# Patient Record
Sex: Female | Born: 1965 | Race: Black or African American | Hispanic: No | Smoking: Never smoker
Health system: Southern US, Community
[De-identification: ages and names within clinical notes are randomized; demographics above are authoritative.]

## PROBLEM LIST (undated history)

## (undated) DIAGNOSIS — E785 Hyperlipidemia, unspecified: Secondary | ICD-10-CM

## (undated) DIAGNOSIS — F419 Anxiety disorder, unspecified: Secondary | ICD-10-CM

## (undated) DIAGNOSIS — Z87442 Personal history of urinary calculi: Secondary | ICD-10-CM

## (undated) DIAGNOSIS — R7303 Prediabetes: Secondary | ICD-10-CM

## (undated) DIAGNOSIS — K219 Gastro-esophageal reflux disease without esophagitis: Secondary | ICD-10-CM

## (undated) DIAGNOSIS — I10 Essential (primary) hypertension: Secondary | ICD-10-CM

## (undated) HISTORY — PX: DILATION AND CURETTAGE OF UTERUS: SHX78

---

## 1997-10-01 ENCOUNTER — Inpatient Hospital Stay (HOSPITAL_COMMUNITY): Admission: AD | Admit: 1997-10-01 | Discharge: 1997-10-01 | Payer: Self-pay | Admitting: *Deleted

## 1997-10-03 ENCOUNTER — Inpatient Hospital Stay (HOSPITAL_COMMUNITY): Admission: AD | Admit: 1997-10-03 | Discharge: 1997-10-07 | Payer: Self-pay | Admitting: Obstetrics and Gynecology

## 1997-10-07 ENCOUNTER — Encounter: Admission: RE | Admit: 1997-10-07 | Discharge: 1998-01-05 | Payer: Self-pay | Admitting: Obstetrics and Gynecology

## 2000-11-22 ENCOUNTER — Other Ambulatory Visit: Admission: RE | Admit: 2000-11-22 | Discharge: 2000-11-22 | Payer: Self-pay | Admitting: Obstetrics and Gynecology

## 2001-11-22 ENCOUNTER — Other Ambulatory Visit: Admission: RE | Admit: 2001-11-22 | Discharge: 2001-11-22 | Payer: Self-pay | Admitting: Obstetrics and Gynecology

## 2003-01-01 ENCOUNTER — Other Ambulatory Visit: Admission: RE | Admit: 2003-01-01 | Discharge: 2003-01-01 | Payer: Self-pay | Admitting: Obstetrics and Gynecology

## 2004-01-13 ENCOUNTER — Other Ambulatory Visit: Admission: RE | Admit: 2004-01-13 | Discharge: 2004-01-13 | Payer: Self-pay | Admitting: Obstetrics and Gynecology

## 2005-04-29 ENCOUNTER — Other Ambulatory Visit: Admission: RE | Admit: 2005-04-29 | Discharge: 2005-04-29 | Payer: Self-pay | Admitting: Obstetrics and Gynecology

## 2011-01-18 ENCOUNTER — Encounter: Payer: Self-pay | Admitting: Emergency Medicine

## 2011-01-18 ENCOUNTER — Inpatient Hospital Stay (INDEPENDENT_AMBULATORY_CARE_PROVIDER_SITE_OTHER)
Admission: RE | Admit: 2011-01-18 | Discharge: 2011-01-18 | Disposition: A | Discharge status: Home / Self Care | Payer: BC Managed Care – PPO | Source: Ambulatory Visit | Attending: Emergency Medicine | Admitting: Emergency Medicine

## 2011-01-18 DIAGNOSIS — L255 Unspecified contact dermatitis due to plants, except food: Secondary | ICD-10-CM | POA: Insufficient documentation

## 2011-07-25 NOTE — Progress Notes (Signed)
Summary: RASH ON ARM AND NECK/TJ rm 2   Vital Signs:  Patient Profile:   45 Years Old Female CC:      Rash  x 1wk Height:     61.5 inches Weight:      152.75 pounds O2 Sat:      99 % O2 treatment:    Room Air Temp:     98.4 degrees F oral Pulse rate:   75 / minute Resp:     14 per minute BP sitting:   128 / 83  (left arm) Cuff size:   regular  Vitals Entered By: Clemens Catholic LPN (Jan 18, 2011 5:51 PM)                  Updated Prior Medication List: VALTREX 500 MG TABS (VALACYCLOVIR HCL)  WELLBUTRIN XL 300 MG XR24H-TAB (BUPROPION HCL)  NUVARING 0.12-0.015 MG/24HR RING (ETONOGESTREL-ETHINYL ESTRADIOL)  CITALOPRAM HYDROBROMIDE 40 MG TABS (CITALOPRAM HYDROBROMIDE)   Current Allergies: ! AMOXICILLINHistory of Present Illness History from: patient Chief Complaint: Rash  x 1wk History of Present Illness: She was outside doing yardwork about a week ago and thinks she got into some poison ivy. She developed an itchy red rash on her arms and now on her neck as well. Very itchy and irritating.  No F/C/N/V.  Benedryl only helping a little bit.  REVIEW OF SYSTEMS Constitutional Symptoms      Denies fever, chills, night sweats, weight loss, weight gain, and fatigue.  Eyes       Denies change in vision, eye pain, eye discharge, glasses, contact lenses, and eye surgery. Ear/Nose/Throat/Mouth       Denies hearing loss/aids, change in hearing, ear pain, ear discharge, dizziness, frequent runny nose, frequent nose bleeds, sinus problems, sore throat, hoarseness, and tooth pain or bleeding.  Respiratory       Denies dry cough, productive cough, wheezing, shortness of breath, asthma, bronchitis, and emphysema/COPD.  Cardiovascular       Denies murmurs, chest pain, and tires easily with exhertion.    Gastrointestinal       Denies stomach pain, nausea/vomiting, diarrhea, constipation, blood in bowel movements, and indigestion. Genitourniary       Denies painful urination, kidney  stones, and loss of urinary control. Neurological       Denies paralysis, seizures, and fainting/blackouts. Musculoskeletal       Denies muscle pain, joint pain, joint stiffness, decreased range of motion, redness, swelling, muscle weakness, and gout.  Skin       Denies bruising, unusual mles/lumps or sores, and hair/skin or nail changes.  Psych       Denies mood changes, temper/anger issues, anxiety/stress, speech problems, depression, and sleep problems. Other Comments: pt c/o rash on both arms and on her neck x 1wk . she has used benadryl pills and cream with no relief.   Past History:  Past Medical History: Unremarkable  Past Surgical History: Caesarean section  Family History: Family History Diabetes 1st degree relative  Social History: Never Smoked Alcohol use-yes 2 per mth Drug use-no Smoking Status:  never Drug Use:  no Physical Exam General appearance: well developed, well nourished, no acute distress Oral/Pharynx: tongue normal, posterior pharynx without erythema or exudate MSE: oriented to time, place, and person Scattered raised excoriations and erythema c/w poison ivy dermatitis on her arms. Assessment New Problems: POISON IVY DERMATITIS (ICD-692.6) POISON IVY DERMATITIS (ICD-692.6) FAMILY HISTORY DIABETES 1ST DEGREE RELATIVE (ICD-V18.0)   Plan New Medications/Changes: PREDNISONE (PAK) 10 MG TABS (PREDNISONE)  use as directed, 6 day pack  #1 x 0, 01/18/2011, Hoyt Koch MD  New Orders: Solumedrol up to 125mg  [J2930] Admin of Therapeutic Inj  intramuscular or subcutaneous [96372] New Patient Level III [99203] Planning Comments:   Treat with Solumedrol IM now + 6 day pred pack taper starting tomorrow.  Avoid scratching, use cool compresses.  Can continue topical or oral benedryl.  Should gradually improve over the next few days but may still get a few breakouts here and there.   The patient and/or caregiver has been counseled thoroughly with regard  to medications prescribed including dosage, schedule, interactions, rationale for use, and possible side effects and they verbalize understanding.  Diagnoses and expected course of recovery discussed and will return if not improved as expected or if the condition worsens. Patient and/or caregiver verbalized understanding.  Prescriptions: PREDNISONE (PAK) 10 MG TABS (PREDNISONE) use as directed, 6 day pack  #1 x 0   Entered and Authorized by:   Hoyt Koch MD   Signed by:   Hoyt Koch MD on 01/18/2011   Method used:   Print then Give to Patient   RxID:   [id number]   Medication Administration  Injection # 1:    Medication: Solumedrol up to 125mg     Diagnosis: POISON IVY DERMATITIS (ICD-692.6)    Route: IM    Site: LUOQ gluteus    Exp Date: 08/22/2013    Lot #: [id number]    Mfr: [employer]    Patient tolerated injection without complications    Given by: Clemens Catholic LPN (Jan 18, 2011 6:19 PM)  Orders Added: 1)  Solumedrol up to 125mg  [J2930] 2)  Admin of Therapeutic Inj  intramuscular or subcutaneous [96372] 3)  New Patient Level III [14782]

## 2014-05-02 ENCOUNTER — Other Ambulatory Visit: Payer: Self-pay | Admitting: Gastroenterology

## 2014-05-02 DIAGNOSIS — R142 Eructation: Secondary | ICD-10-CM

## 2014-05-13 ENCOUNTER — Ambulatory Visit
Admission: RE | Admit: 2014-05-13 | Discharge: 2014-05-13 | Disposition: A | Discharge status: Home / Self Care | Payer: BC Managed Care – PPO | Source: Ambulatory Visit | Attending: Gastroenterology | Admitting: Gastroenterology

## 2014-05-13 ENCOUNTER — Encounter (INDEPENDENT_AMBULATORY_CARE_PROVIDER_SITE_OTHER): Payer: Self-pay

## 2014-05-13 DIAGNOSIS — R142 Eructation: Secondary | ICD-10-CM

## 2015-01-28 ENCOUNTER — Other Ambulatory Visit: Payer: Self-pay | Admitting: Oncology

## 2015-11-27 IMAGING — RF DG UGI W/ HIGH DENSITY W/KUB
19 of 24 series · 19 of 24 positions shown · non-contrast
Comparison: None.

CLINICAL DATA: Gastroesophageal reflux.

EXAM:
UPPER GI SERIES WITH KUB
TECHNIQUE: After obtaining a scout radiograph a routine upper GI series was
performed using thin and high density barium.
FLUOROSCOPY TIME:  2 min and 18 seconds

[Series 1: run · 1 of 1 slices shown (1 of 19)]
[im 1/1]
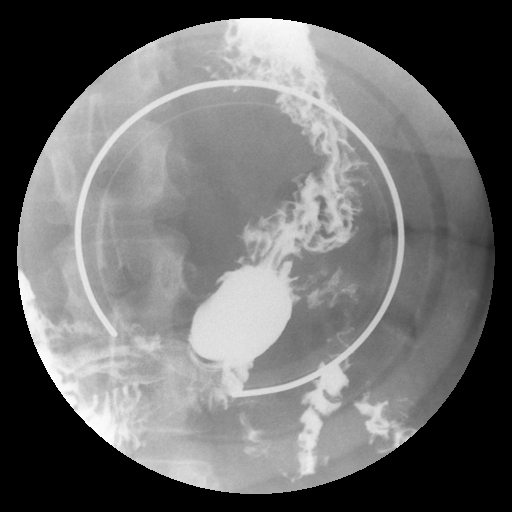

[Series 2: run · 1 of 1 slices shown (2 of 19)]
[im 1/1]
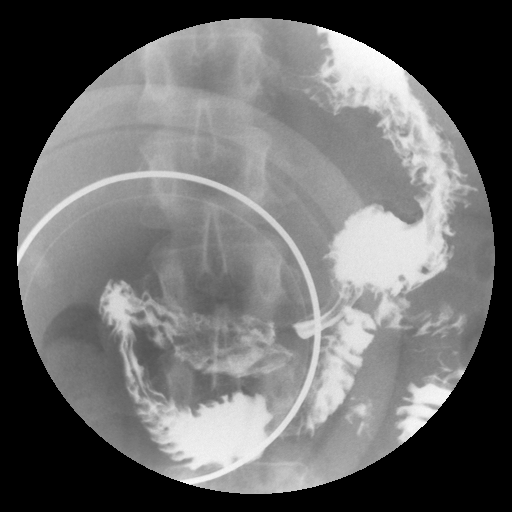

[Series 4: run · 1 of 1 slices shown (3 of 19)]
[im 1/1]
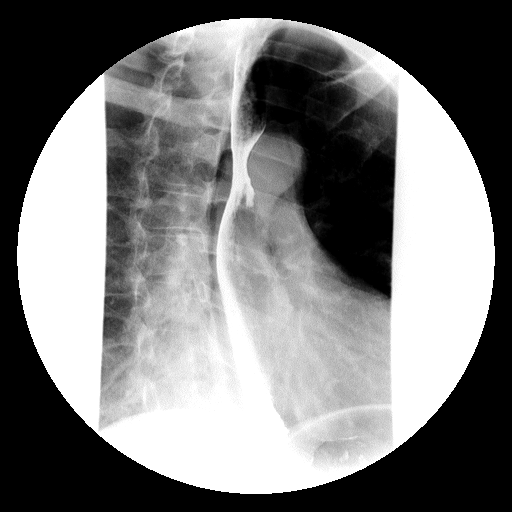

[Series 5: run · 1 of 1 slices shown (4 of 19)]
[im 1/1]
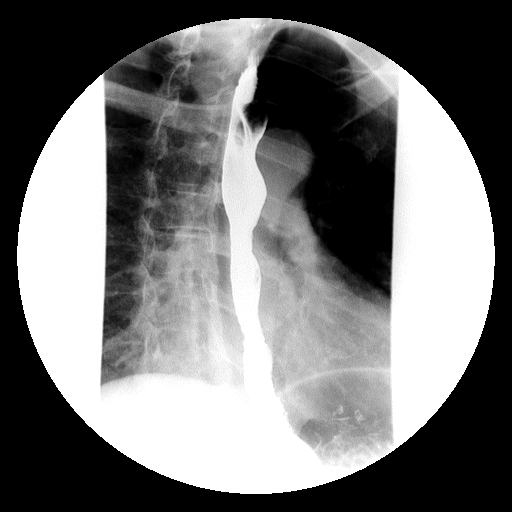

[Series 6: run · 1 of 1 slices shown (5 of 19)]
[im 1/1]
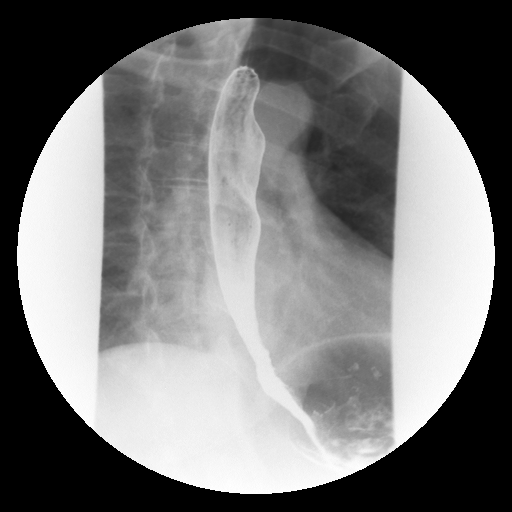

[Series 7: run · 1 of 1 slices shown (6 of 19)]
[im 1/1]
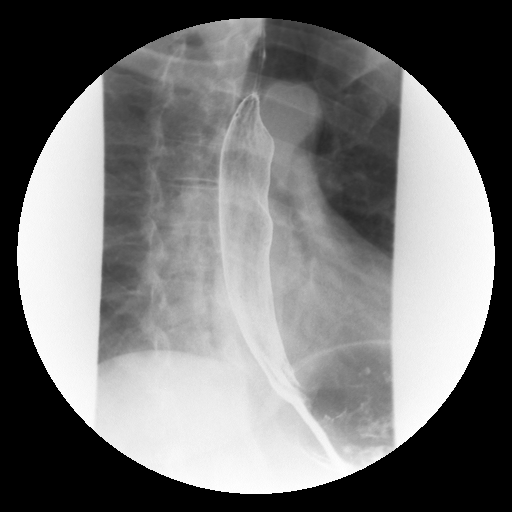

[Series 9: run · 1 of 1 slices shown (7 of 19)]
[im 1/1]
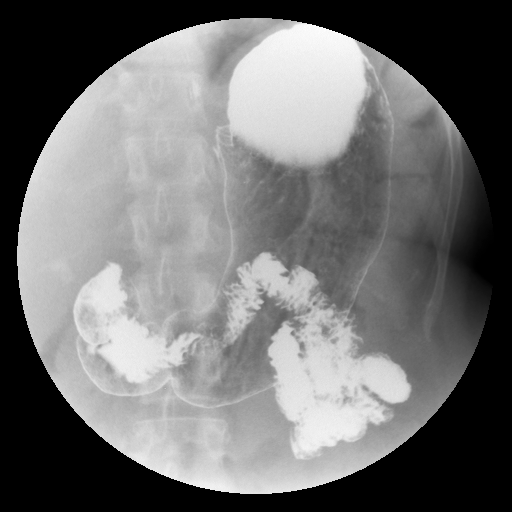

[Series 10: run · 1 of 1 slices shown (8 of 19)]
[im 1/1]
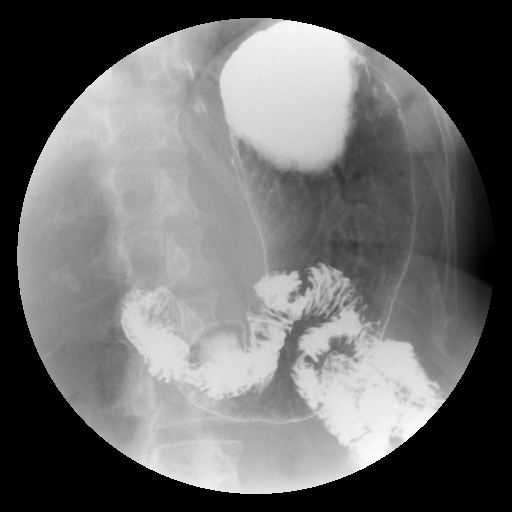

[Series 11: run · 1 of 1 slices shown (9 of 19)]
[im 1/1]
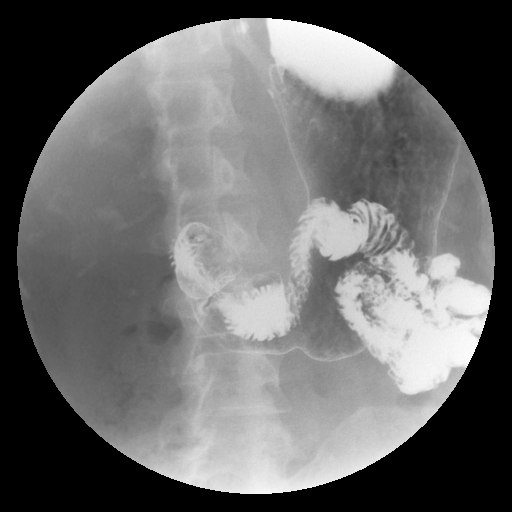

[Series 13: run · 1 of 1 slices shown (10 of 19)]
[im 1/1]
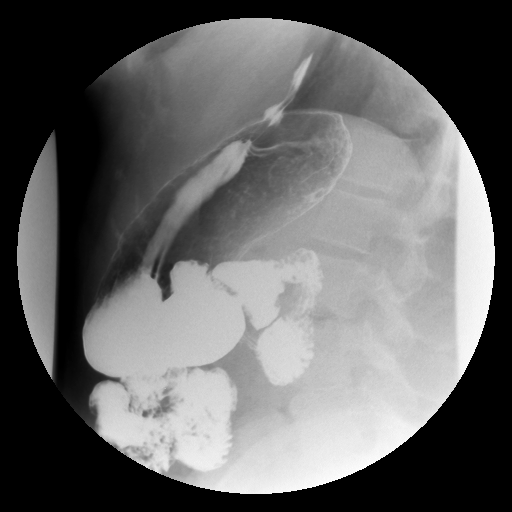

[Series 14: run · 1 of 1 slices shown (11 of 19)]
[im 1/1]
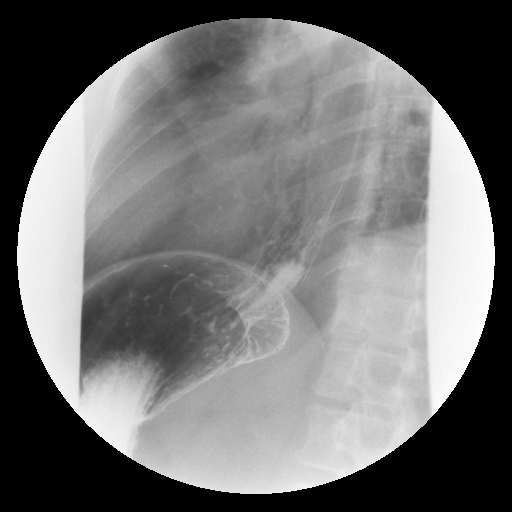

[Series 15: run · 1 of 1 slices shown (12 of 19)]
[im 1/1]
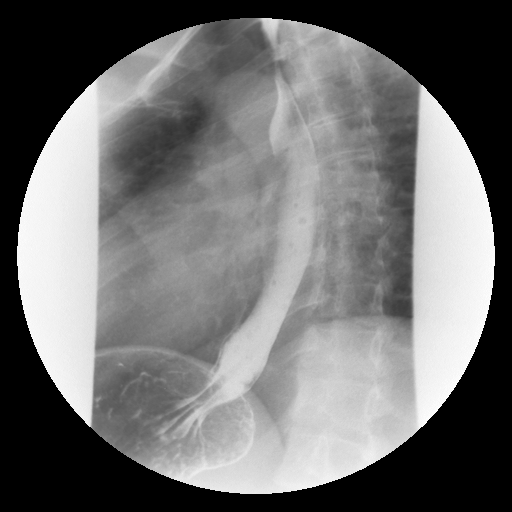

[Series 16: run · 1 of 1 slices shown (13 of 19)]
[im 1/1]
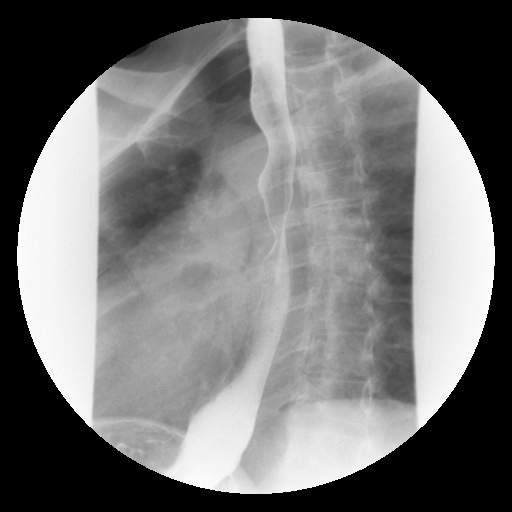

[Series 18: run · 1 of 1 slices shown (14 of 19)]
[im 1/1]
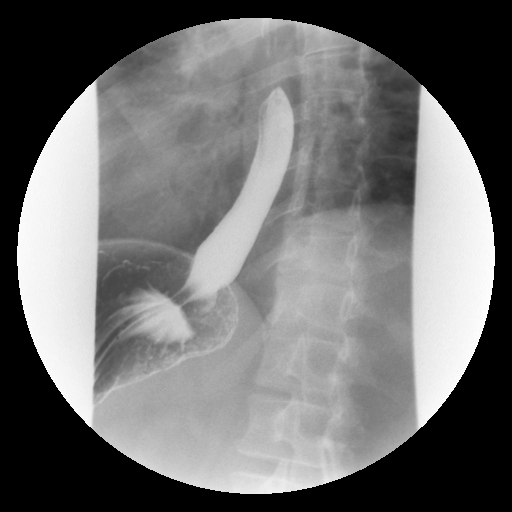

[Series 19: run · 1 of 1 slices shown (15 of 19)]
[im 1/1]
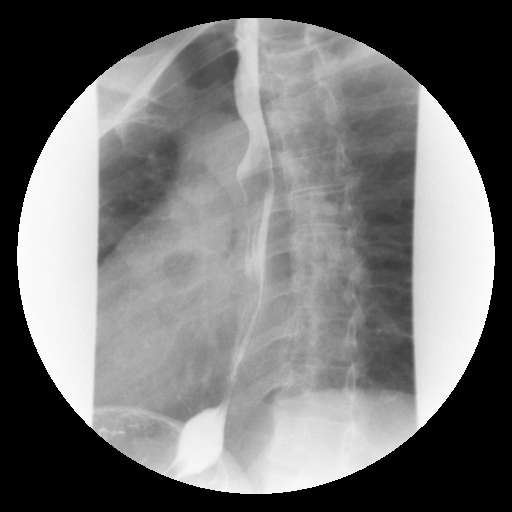

[Series 20: run · 1 of 1 slices shown (16 of 19)]
[im 1/1]
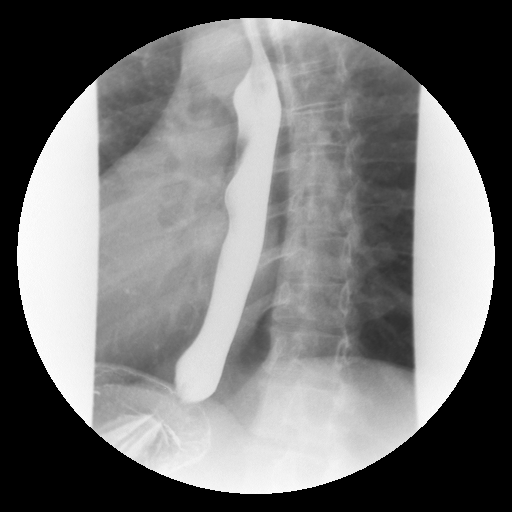

[Series 21: run · 1 of 1 slices shown (17 of 19)]
[im 1/1]
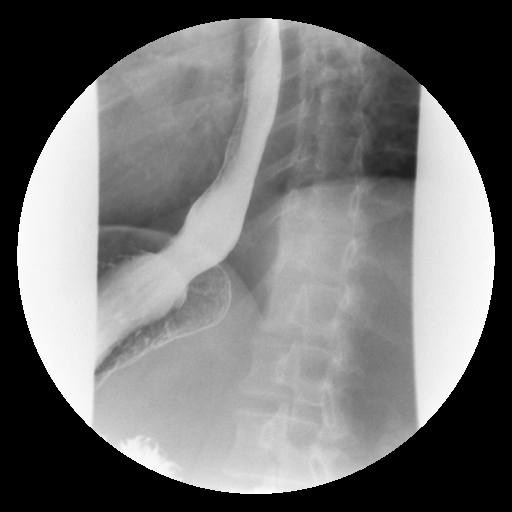

[Series 23: run · 1 of 1 slices shown (18 of 19)]
[im 1/1]
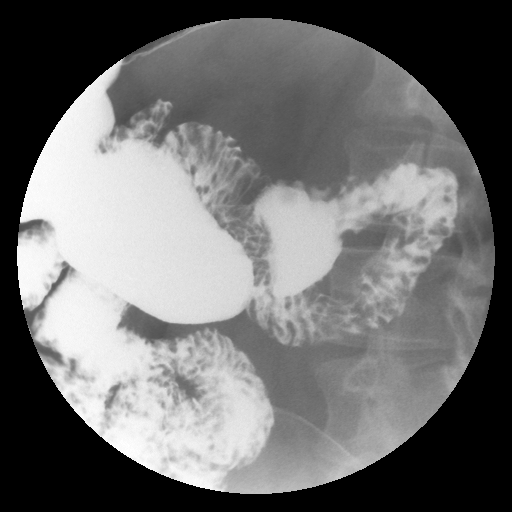

[Series 24: run · 1 of 1 slices shown (19 of 19)]
[im 1/1]
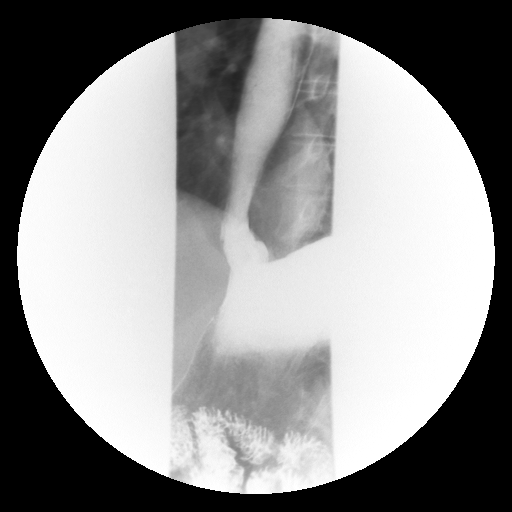

[19 of 24 positions shown; findings below may reference images not displayed]

FINDINGS: The initial KUB demonstrates right lower pole renal calculus and
moderate to large amount of stool throughout the colon.

Initial barium swallows demonstrate normal esophageal motility. No
intrinsic or extrinsic lesions of the esophagus were identified and
no mucosal abnormalities are seen. Very small sliding-type hiatal
hernia is noted with inducible GE reflux.

The stomach, duodenum bulb and C-loop are normal. Normal mucosal
folds. The proximal loops of jejunum are normal.
IMPRESSION: 1. Very small sliding-type hiatal hernia with inducible GE reflux.
2. Normal examination of the stomach, duodenum bulb and C-loop.
3. Right lower pole renal calculus.
4. Moderate to large amount of stool throughout the colon.

## 2018-09-03 ENCOUNTER — Encounter: Payer: Self-pay | Admitting: Neurology

## 2018-09-03 ENCOUNTER — Other Ambulatory Visit: Payer: Self-pay | Admitting: *Deleted

## 2018-09-03 DIAGNOSIS — R200 Anesthesia of skin: Secondary | ICD-10-CM

## 2018-09-05 ENCOUNTER — Ambulatory Visit (INDEPENDENT_AMBULATORY_CARE_PROVIDER_SITE_OTHER): Payer: BLUE CROSS/BLUE SHIELD | Admitting: Neurology

## 2018-09-05 ENCOUNTER — Encounter

## 2018-09-05 DIAGNOSIS — R200 Anesthesia of skin: Secondary | ICD-10-CM

## 2018-09-05 DIAGNOSIS — G5603 Carpal tunnel syndrome, bilateral upper limbs: Secondary | ICD-10-CM

## 2018-09-05 NOTE — Procedures (Signed)
[facility]  [address] [place], [address]  [place], [place] [address] Tel: [phone] Fax:  [phone] Test Date:  09/05/2018  Patient: Catherine Odonnell DOB: 08-18-1966 Physician: Nita Sickle, DO  Sex: Female Height: 5\' 2"  Ref Phys: Cindee Salt, MD  ID#: [id number] Temp: 36.0C Technician:    Patient Complaints: This is a 53 year old female with bilateral hand tingling referred for evaluation of carpal tunnel syndrome.  NCV & EMG Findings: Extensive electrodiagnostic testing of the left upper extremity and additional studies of the right shows:  1. Bilateral median sensory responses show prolonged distal peak latency (L6.1, R6.0 ms) and reduced amplitude (L5.9, R8.3 V).  Bilateral ulnar sensory responses are within normal limits. 2. Bilateral median motor responses show prolonged distal onset latency (L5.0, R5.0 ms) and normal amplitude.  Bilateral ulnar motor responses are within normal limits. 3. Reduced recruitment pattern is seen in bilateral abductor pollicis brevis muscles, without changes in motor unit configuration or evidence of fibrillation potentials.  Impression: Bilateral median neuropathy at or distal to the wrist, consistent with a clinical diagnosis of carpal tunnel syndrome.  Overall, these findings are moderate-to-severe in degree electrically.   ___________________________ Nita Sickle, DO    Nerve Conduction Studies Anti Sensory Summary Table   Site NR Peak (ms) Norm Peak (ms) P-T Amp (V) Norm P-T Amp  Left Median Anti Sensory (2nd Digit)  36C  Wrist    6.1 <3.6 5.9 >15  Right Median Anti Sensory (2nd Digit)  36C  Wrist    6.0 <3.6 8.3 >15  Left Ulnar Anti Sensory (5th Digit)  36C  Wrist    2.5 <3.1 27.2 >10  Right Ulnar Anti Sensory (5th Digit)  36C  Wrist    2.5 <3.1 27.0 >10   Motor Summary Table   Site NR Onset (ms) Norm Onset (ms) O-P Amp (mV) Norm O-P Amp Site1 Site2 Delta-0 (ms) Dist (cm) Vel (m/s) Norm Vel (m/s)  Left Median  Motor (Abd Poll Brev)  36C  Wrist    5.0 <4.0 9.7 >6 Elbow Wrist 5.0 29.0 58 >50  Elbow    10.0  9.5         Right Median Motor (Abd Poll Brev)  36C  Wrist    5.0 <4.0 9.0 >6 Elbow Wrist 5.3 30.0 57 >50  Elbow    10.3  8.6         Left Ulnar Motor (Abd Dig Minimi)  36C  Wrist    2.1 <3.1 9.2 >7 B Elbow Wrist 3.7 23.0 62 >50  B Elbow    5.8  9.0  A Elbow B Elbow 1.7 10.0 59 >50  A Elbow    7.5  8.3         Right Ulnar Motor (Abd Dig Minimi)  36C  Wrist    1.8 <3.1 9.2 >7 B Elbow Wrist 4.1 24.0 59 >50  B Elbow    5.9  8.9  A Elbow B Elbow 1.9 10.0 53 >50  A Elbow    7.8  8.5          EMG   Side Muscle Ins Act Fibs Psw Fasc Number Recrt Dur Dur. Amp Amp. Poly Poly. Comment  Left 1stDorInt Nml Nml Nml Nml Nml Nml Nml Nml Nml Nml Nml Nml N/A  Left Abd Poll Brev Nml Nml Nml Nml 1- Rapid Nml Nml Nml Nml Nml Nml N/A  Left PronatorTeres Nml Nml Nml Nml Nml Nml Nml Nml Nml Nml Nml Nml N/A  Left Biceps Nml Nml Nml Nml Nml Nml Nml Nml Nml Nml Nml Nml N/A  Left Triceps Nml Nml Nml Nml Nml Nml Nml Nml Nml Nml Nml Nml N/A  Left Deltoid Nml Nml Nml Nml Nml Nml Nml Nml Nml Nml Nml Nml N/A  Right 1stDorInt Nml Nml Nml Nml Nml Nml Nml Nml Nml Nml Nml Nml N/A  Right Abd Poll Brev Nml Nml Nml Nml 1- Rapid Nml Nml Nml Nml Nml Nml N/A  Right PronatorTeres Nml Nml Nml Nml Nml Nml Nml Nml Nml Nml Nml Nml N/A  Right Biceps Nml Nml Nml Nml Nml Nml Nml Nml Nml Nml Nml Nml N/A  Right Triceps Nml Nml Nml Nml Nml Nml Nml Nml Nml Nml Nml Nml N/A  Right Deltoid Nml Nml Nml Nml Nml Nml Nml Nml Nml Nml Nml Nml N/A      Waveforms:

## 2018-09-13 ENCOUNTER — Encounter: Payer: Self-pay | Admitting: Neurology

## 2019-01-21 ENCOUNTER — Other Ambulatory Visit: Payer: Self-pay | Admitting: Orthopedic Surgery

## 2019-01-22 ENCOUNTER — Other Ambulatory Visit: Payer: Self-pay

## 2019-01-22 ENCOUNTER — Encounter (HOSPITAL_BASED_OUTPATIENT_CLINIC_OR_DEPARTMENT_OTHER): Payer: Self-pay

## 2019-01-25 ENCOUNTER — Encounter (HOSPITAL_BASED_OUTPATIENT_CLINIC_OR_DEPARTMENT_OTHER)
Admission: RE | Admit: 2019-01-25 | Discharge: 2019-01-25 | Disposition: A | Discharge status: Home / Self Care | Payer: BC Managed Care – PPO | Source: Ambulatory Visit | Attending: Orthopedic Surgery | Admitting: Orthopedic Surgery

## 2019-01-25 ENCOUNTER — Other Ambulatory Visit: Payer: Self-pay

## 2019-01-25 ENCOUNTER — Other Ambulatory Visit (HOSPITAL_COMMUNITY)
Admission: RE | Admit: 2019-01-25 | Discharge: 2019-01-25 | Disposition: A | Discharge status: Home / Self Care | Payer: BC Managed Care – PPO | Source: Ambulatory Visit | Attending: Orthopedic Surgery | Admitting: Orthopedic Surgery

## 2019-01-25 DIAGNOSIS — Z01818 Encounter for other preprocedural examination: Secondary | ICD-10-CM | POA: Diagnosis not present

## 2019-01-25 DIAGNOSIS — Z1159 Encounter for screening for other viral diseases: Secondary | ICD-10-CM | POA: Diagnosis not present

## 2019-01-25 DIAGNOSIS — I10 Essential (primary) hypertension: Secondary | ICD-10-CM | POA: Diagnosis not present

## 2019-01-25 LAB — BASIC METABOLIC PANEL
Anion gap: 8 (ref 5–15)
BUN: 11 mg/dL (ref 6–20)
CO2: 24 mmol/L (ref 22–32)
Calcium: 9.8 mg/dL (ref 8.9–10.3)
Chloride: 108 mmol/L (ref 98–111)
Creatinine, Ser: 0.77 mg/dL (ref 0.44–1.00)
GFR calc Af Amer: >60 mL/min (ref >60)
GFR calc non Af Amer: >60 mL/min (ref >60)
Glucose, Bld: 91 mg/dL (ref 70–99)
Potassium: 4.2 mmol/L (ref 3.5–5.1)
Sodium: 140 mmol/L (ref 135–145)

## 2019-01-25 LAB — POCT PREGNANCY, URINE: Preg Test, Ur: NEGATIVE

## 2019-01-25 NOTE — Progress Notes (Signed)
Ensure pre surgery drink given with instructions to complete by 0915 dos, pt verbalized understanding. 

## 2019-01-26 LAB — NOVEL CORONAVIRUS, NAA (HOSP ORDER, SEND-OUT TO REF LAB; TAT 18-24 HRS): SARS-CoV-2, NAA: NOT DETECTED

## 2019-01-29 ENCOUNTER — Encounter (HOSPITAL_BASED_OUTPATIENT_CLINIC_OR_DEPARTMENT_OTHER): Payer: Self-pay | Admitting: Anesthesiology

## 2019-01-29 ENCOUNTER — Other Ambulatory Visit: Payer: Self-pay

## 2019-01-29 ENCOUNTER — Ambulatory Visit (HOSPITAL_BASED_OUTPATIENT_CLINIC_OR_DEPARTMENT_OTHER): Payer: BC Managed Care – PPO | Admitting: Certified Registered"

## 2019-01-29 ENCOUNTER — Encounter (HOSPITAL_BASED_OUTPATIENT_CLINIC_OR_DEPARTMENT_OTHER)
Admission: RE | Disposition: A | Discharge status: Home / Self Care | Payer: Self-pay | Source: Home / Self Care | Attending: Orthopedic Surgery

## 2019-01-29 ENCOUNTER — Ambulatory Visit (HOSPITAL_BASED_OUTPATIENT_CLINIC_OR_DEPARTMENT_OTHER)
Admission: RE | Admit: 2019-01-29 | Discharge: 2019-01-29 | Disposition: A | Discharge status: Home / Self Care | Payer: BC Managed Care – PPO | Attending: Orthopedic Surgery | Admitting: Orthopedic Surgery

## 2019-01-29 DIAGNOSIS — Z833 Family history of diabetes mellitus: Secondary | ICD-10-CM | POA: Insufficient documentation

## 2019-01-29 DIAGNOSIS — K219 Gastro-esophageal reflux disease without esophagitis: Secondary | ICD-10-CM | POA: Diagnosis not present

## 2019-01-29 DIAGNOSIS — I10 Essential (primary) hypertension: Secondary | ICD-10-CM | POA: Diagnosis not present

## 2019-01-29 DIAGNOSIS — E785 Hyperlipidemia, unspecified: Secondary | ICD-10-CM | POA: Diagnosis not present

## 2019-01-29 DIAGNOSIS — G5603 Carpal tunnel syndrome, bilateral upper limbs: Secondary | ICD-10-CM | POA: Diagnosis present

## 2019-01-29 DIAGNOSIS — F419 Anxiety disorder, unspecified: Secondary | ICD-10-CM | POA: Insufficient documentation

## 2019-01-29 DIAGNOSIS — Z880 Allergy status to penicillin: Secondary | ICD-10-CM | POA: Insufficient documentation

## 2019-01-29 HISTORY — DX: Gastro-esophageal reflux disease without esophagitis: K21.9

## 2019-01-29 HISTORY — DX: Hyperlipidemia, unspecified: E78.5

## 2019-01-29 HISTORY — DX: Essential (primary) hypertension: I10

## 2019-01-29 HISTORY — PX: CARPAL TUNNEL RELEASE: SHX101

## 2019-01-29 HISTORY — DX: Anxiety disorder, unspecified: F41.9

## 2019-01-29 SURGERY — CARPAL TUNNEL RELEASE
Anesthesia: Monitor Anesthesia Care | Site: Wrist | Laterality: Left

## 2019-01-29 MED ORDER — FENTANYL CITRATE (PF) 100 MCG/2ML IJ SOLN
INTRAMUSCULAR | Status: AC
  Filled 2019-01-29: qty 2

## 2019-01-29 MED ORDER — CEFAZOLIN SODIUM-DEXTROSE 2-4 GM/100ML-% IV SOLN
INTRAVENOUS | Status: AC
  Filled 2019-01-29: qty 100

## 2019-01-29 MED ORDER — MIDAZOLAM HCL 5 MG/5ML IJ SOLN
INTRAMUSCULAR | Status: DC | PRN
  Administered 2019-01-29: 2 mg via INTRAVENOUS

## 2019-01-29 MED ORDER — ONDANSETRON HCL 4 MG/2ML IJ SOLN
INTRAMUSCULAR | Status: AC
  Filled 2019-01-29: qty 2

## 2019-01-29 MED ORDER — VANCOMYCIN HCL IN DEXTROSE 1-5 GM/200ML-% IV SOLN
INTRAVENOUS | Status: AC
  Filled 2019-01-29: qty 200

## 2019-01-29 MED ORDER — PROPOFOL 10 MG/ML IV BOLUS
INTRAVENOUS | Status: AC
  Filled 2019-01-29: qty 20

## 2019-01-29 MED ORDER — MIDAZOLAM HCL 2 MG/2ML IJ SOLN: 1.0000 mg | INTRAMUSCULAR | Status: DC | PRN

## 2019-01-29 MED ORDER — FENTANYL CITRATE (PF) 100 MCG/2ML IJ SOLN
25.0000 ug | INTRAMUSCULAR | Status: DC | PRN
  Administered 2019-01-29: 50 ug via INTRAVENOUS

## 2019-01-29 MED ORDER — ONDANSETRON HCL 4 MG/2ML IJ SOLN
INTRAMUSCULAR | Status: DC | PRN
  Administered 2019-01-29: 4 mg via INTRAVENOUS

## 2019-01-29 MED ORDER — BUPIVACAINE HCL (PF) 0.25 % IJ SOLN
INTRAMUSCULAR | Status: DC | PRN
  Administered 2019-01-29: 10 mL

## 2019-01-29 MED ORDER — FENTANYL CITRATE (PF) 100 MCG/2ML IJ SOLN: 50.0000 ug | INTRAMUSCULAR | Status: DC | PRN

## 2019-01-29 MED ORDER — OXYCODONE HCL 5 MG/5ML PO SOLN: 5.0000 mg | Freq: Once | ORAL | Status: DC | PRN

## 2019-01-29 MED ORDER — LACTATED RINGERS IV SOLN
INTRAVENOUS | Status: DC
  Administered 2019-01-29: 11:00:00 via INTRAVENOUS

## 2019-01-29 MED ORDER — MIDAZOLAM HCL 2 MG/2ML IJ SOLN
INTRAMUSCULAR | Status: AC
  Filled 2019-01-29: qty 2

## 2019-01-29 MED ORDER — SCOPOLAMINE 1 MG/3DAYS TD PT72: 1.0000 | MEDICATED_PATCH | Freq: Once | TRANSDERMAL | Status: DC | PRN

## 2019-01-29 MED ORDER — DEXAMETHASONE SODIUM PHOSPHATE 10 MG/ML IJ SOLN
INTRAMUSCULAR | Status: AC
  Filled 2019-01-29: qty 1

## 2019-01-29 MED ORDER — VANCOMYCIN HCL IN DEXTROSE 1-5 GM/200ML-% IV SOLN
1000.0000 mg | INTRAVENOUS | Status: AC
  Administered 2019-01-29: 1000 mg via INTRAVENOUS

## 2019-01-29 MED ORDER — HYDROCODONE-ACETAMINOPHEN 5-325 MG PO TABS: 1.0000 | ORAL_TABLET | Freq: Four times a day (QID) | ORAL | 0 refills | Status: DC | PRN

## 2019-01-29 MED ORDER — OXYCODONE HCL 5 MG PO TABS: 5.0000 mg | ORAL_TABLET | Freq: Once | ORAL | Status: DC | PRN

## 2019-01-29 MED ORDER — LIDOCAINE HCL (CARDIAC) PF 100 MG/5ML IV SOSY
PREFILLED_SYRINGE | INTRAVENOUS | Status: DC | PRN
  Administered 2019-01-29: 50 mg via INTRAVENOUS

## 2019-01-29 MED ORDER — DEXAMETHASONE SODIUM PHOSPHATE 10 MG/ML IJ SOLN
INTRAMUSCULAR | Status: DC | PRN
  Administered 2019-01-29: 5 mg via INTRAVENOUS

## 2019-01-29 MED ORDER — LIDOCAINE 2% (20 MG/ML) 5 ML SYRINGE
INTRAMUSCULAR | Status: AC
  Filled 2019-01-29: qty 5

## 2019-01-29 MED ORDER — METOCLOPRAMIDE HCL 5 MG/ML IJ SOLN: 10.0000 mg | Freq: Once | INTRAMUSCULAR | Status: DC | PRN

## 2019-01-29 MED ORDER — FENTANYL CITRATE (PF) 100 MCG/2ML IJ SOLN
INTRAMUSCULAR | Status: DC | PRN
  Administered 2019-01-29: 50 ug via INTRAVENOUS
  Administered 2019-01-29: 25 ug via INTRAVENOUS

## 2019-01-29 MED ORDER — CHLORHEXIDINE GLUCONATE 4 % EX LIQD: 60.0000 mL | Freq: Once | CUTANEOUS | Status: DC

## 2019-01-29 MED ORDER — MEPERIDINE HCL 25 MG/ML IJ SOLN: 6.2500 mg | INTRAMUSCULAR | Status: DC | PRN

## 2019-01-29 MED ORDER — PROPOFOL 500 MG/50ML IV EMUL
INTRAVENOUS | Status: DC | PRN
  Administered 2019-01-29: 75 ug/kg/min via INTRAVENOUS

## 2019-01-29 SURGICAL SUPPLY — 39 items
APL PRP STRL LF DISP 70% ISPRP (MISCELLANEOUS) ×1
BLADE SURG 15 STRL LF DISP TIS (BLADE) ×1 IMPLANT
BLADE SURG 15 STRL SS (BLADE) ×3
BNDG CMPR 9X4 STRL LF SNTH (GAUZE/BANDAGES/DRESSINGS) ×1
BNDG COHESIVE 3X5 TAN STRL LF (GAUZE/BANDAGES/DRESSINGS) ×3 IMPLANT
BNDG ESMARK 4X9 LF (GAUZE/BANDAGES/DRESSINGS) ×2 IMPLANT
BNDG GAUZE ELAST 4 BULKY (GAUZE/BANDAGES/DRESSINGS) ×3 IMPLANT
CHLORAPREP W/TINT 26 (MISCELLANEOUS) ×3 IMPLANT
CORD BIPOLAR FORCEPS 12FT (ELECTRODE) ×3 IMPLANT
COVER BACK TABLE REUSABLE LG (DRAPES) ×3 IMPLANT
COVER MAYO STAND REUSABLE (DRAPES) ×3 IMPLANT
COVER WAND RF STERILE (DRAPES) IMPLANT
CUFF TOURN SGL QUICK 18X4 (TOURNIQUET CUFF) ×3 IMPLANT
DRAPE EXTREMITY T 121X128X90 (DISPOSABLE) ×3 IMPLANT
DRAPE SURG 17X23 STRL (DRAPES) ×3 IMPLANT
DRSG PAD ABDOMINAL 8X10 ST (GAUZE/BANDAGES/DRESSINGS) ×3 IMPLANT
GAUZE SPONGE 4X4 12PLY STRL (GAUZE/BANDAGES/DRESSINGS) ×3 IMPLANT
GAUZE XEROFORM 1X8 LF (GAUZE/BANDAGES/DRESSINGS) ×3 IMPLANT
GLOVE BIO SURGEON STRL SZ 6.5 (GLOVE) ×2 IMPLANT
GLOVE BIO SURGEONS STRL SZ 6.5 (GLOVE) ×2
GLOVE BIOGEL PI IND STRL 7.0 (GLOVE) IMPLANT
GLOVE BIOGEL PI IND STRL 8.5 (GLOVE) ×1 IMPLANT
GLOVE BIOGEL PI INDICATOR 7.0 (GLOVE) ×4
GLOVE BIOGEL PI INDICATOR 8.5 (GLOVE) ×2
GLOVE SURG ORTHO 8.0 STRL STRW (GLOVE) ×3 IMPLANT
GOWN STRL REUS W/ TWL LRG LVL3 (GOWN DISPOSABLE) ×1 IMPLANT
GOWN STRL REUS W/TWL LRG LVL3 (GOWN DISPOSABLE) ×3
GOWN STRL REUS W/TWL XL LVL3 (GOWN DISPOSABLE) ×3 IMPLANT
NDL PRECISIONGLIDE 27X1.5 (NEEDLE) IMPLANT
NEEDLE PRECISIONGLIDE 27X1.5 (NEEDLE) IMPLANT
NS IRRIG 1000ML POUR BTL (IV SOLUTION) ×3 IMPLANT
PACK BASIN DAY SURGERY FS (CUSTOM PROCEDURE TRAY) ×3 IMPLANT
STOCKINETTE 4X48 STRL (DRAPES) ×3 IMPLANT
SUT ETHILON 4 0 PS 2 18 (SUTURE) ×3 IMPLANT
SUT VICRYL 4-0 PS2 18IN ABS (SUTURE) IMPLANT
SYR BULB 3OZ (MISCELLANEOUS) ×3 IMPLANT
SYR CONTROL 10ML LL (SYRINGE) IMPLANT
TOWEL GREEN STERILE FF (TOWEL DISPOSABLE) ×3 IMPLANT
UNDERPAD 30X30 (UNDERPADS AND DIAPERS) ×3 IMPLANT

## 2019-01-29 NOTE — Op Note (Signed)
Preoperative diagnosis: Carpal tunnel syndrome left hand  Postoperative diagnosis: Same  Operation decompression median nerve left hand  Anesthesia sedation regional with local  Assistant: None  Indications: Patient is a 53 year old female with a history of bilateral carpal tunnel syndrome with numbness and tingling which has not responded to conservative treatment.  EMG nerve conductions are positive for carpal tunnel syndrome.  Preoperative area the patient is seen extremity marked by both patient and surgeon antibiotic given  Procedure: Patient is brought to the operating room where a forearm-based IV regional anesthetic was carried out without difficulty under the direction of the anesthesia department.  She was prepped using ChloraPrep and in supine position with a left arm free.  A three-minute dry time was allowed and a timeout taken confirming patient procedure.  A longitudinal incision was made left palm carried down through subcutaneous tissue.  Bleeders were electrocauterized with bipolar.  The palmar fascia was split.  The superficial palmar arch and flexor tendons of the ring little finger were identified.  Retractors were placed retracting median nerve flexor tendons radially and ulnar nerve ulnarly.  An incision was then made on the ulnar border of the flexor retinaculum carried down to the wrist.  Right angle and stool retractor placed pink skin and forearm fascia the fascia was released for approximately 2 cm 3 cm under direct vision after dissecting deep structures free.  The nerve was explored.  Area compression to to the nerve was apparent.  Motor branch entered in the muscle distally.  The wound was copiously irrigated with saline.  The skin was closed and opted for nylon sutures.  Local infiltration quarter percent bupivacaine without epinephrine was given.  Approximately 8 cc was used.  Sterile compressive dressing was applied with the fingers free.  And deflation of the tourniquet  all fingers immediately pink.  She was taken to the recovery room for observation in satisfactory condition.  She will be discharged home to return [facility] in 1 week Tylenol ibuprofen for pain with Ultram as a backup.

## 2019-01-29 NOTE — Brief Op Note (Signed)
01/29/2019  11:33 AM  PATIENT:  Catherine Odonnell  53 y.o. female  PRE-OPERATIVE DIAGNOSIS:  LEFT CARPAL TUNNEL SYNDROME  POST-OPERATIVE DIAGNOSIS:  LEFT CARPAL TUNNEL SYNDROME  PROCEDURE:  Procedure(s): LEFT CARPAL TUNNEL RELEASE (Left)  SURGEON:  Surgeon(s) and Role:    Daryll Brod, MD - Primary  PHYSICIAN ASSISTANT:   ASSISTANTS: none   ANESTHESIA:   local, regional and IV sedation  EBL:  2 mL   BLOOD ADMINISTERED:none  DRAINS: none   LOCAL MEDICATIONS USED:  BUPIVICAINE   SPECIMEN:  No Specimen  DISPOSITION OF SPECIMEN:  N/A  COUNTS:  YES  TOURNIQUET:   Total Tourniquet Time Documented: Forearm (Left) - 19 minutes Total: Forearm (Left) - 19 minutes   DICTATION: .Viviann Spare Dictation  PLAN OF CARE: Discharge to home after PACU  PATIENT DISPOSITION:  PACU - hemodynamically stable.

## 2019-01-29 NOTE — Anesthesia Preprocedure Evaluation (Signed)
Anesthesia Evaluation  Patient identified by MRN, date of birth, ID band Patient awake    Reviewed: Allergy & Precautions, NPO status , Patient's Chart, lab work & pertinent test results  Airway Mallampati: II  TM Distance: >3 FB Neck ROM: Full    Dental no notable dental hx. (+) Teeth Intact   Pulmonary neg pulmonary ROS,    Pulmonary exam normal breath sounds clear to auscultation       Cardiovascular hypertension, Pt. on medications Normal cardiovascular exam Rhythm:Regular Rate:Normal     Neuro/Psych Anxiety Left Carpal Tunnel Syndrome    GI/Hepatic Neg liver ROS, GERD  Medicated and Controlled,  Endo/Other  Hyperlipidemia  Renal/GU negative Renal ROS  negative genitourinary   Musculoskeletal negative musculoskeletal ROS (+)   Abdominal   Peds  Hematology   Anesthesia Other Findings   Reproductive/Obstetrics                             Anesthesia Physical Anesthesia Plan  ASA: II  Anesthesia Plan: Bier Block and MAC and Bier Block-LIDOCAINE ONLY   Post-op Pain Management:    Induction: Intravenous  PONV Risk Score and Plan: 2 and Ondansetron, Propofol infusion and Treatment may vary due to age or medical condition  Airway Management Planned: Natural Airway and Nasal Cannula  Additional Equipment:   Intra-op Plan:   Post-operative Plan:   Informed Consent: I have reviewed the patients History and Physical, chart, labs and discussed the procedure including the risks, benefits and alternatives for the proposed anesthesia with the patient or authorized representative who has indicated his/her understanding and acceptance.       Plan Discussed with: CRNA and Surgeon  Anesthesia Plan Comments:         Anesthesia Quick Evaluation

## 2019-01-29 NOTE — Discharge Instructions (Addendum)

## 2019-01-29 NOTE — Transfer of Care (Signed)
Immediate Anesthesia Transfer of Care Note  Patient: Catherine Odonnell  Procedure(s) Performed: LEFT CARPAL TUNNEL RELEASE (Left Wrist)  Patient Location: PACU  Anesthesia Type:MAC  Level of Consciousness: awake and alert   Airway & Oxygen Therapy: Patient Spontanous Breathing  Post-op Assessment: Report given to RN and Post -op Vital signs reviewed and stable  Post vital signs: Reviewed and stable  Last Vitals:  Vitals Value Taken Time  BP 154/93 01/29/2019 11:39 AM  Temp    Pulse 63 01/29/2019 11:41 AM  Resp 15 01/29/2019 11:41 AM  SpO2 95 % 01/29/2019 11:41 AM  Vitals shown include unvalidated device data.  Last Pain:  Vitals:   01/29/19 1039  PainSc: 0-No pain         Complications: No apparent anesthesia complications

## 2019-01-29 NOTE — Anesthesia Postprocedure Evaluation (Signed)
Anesthesia Post Note  Patient: Kelsye Loomer  Procedure(s) Performed: LEFT CARPAL TUNNEL RELEASE (Left Wrist)     Patient location during evaluation: PACU Anesthesia Type: MAC and Bier Block Level of consciousness: awake and alert and oriented Pain management: pain level controlled Vital Signs Assessment: post-procedure vital signs reviewed and stable Respiratory status: spontaneous breathing, nonlabored ventilation and respiratory function stable Cardiovascular status: stable and blood pressure returned to baseline Postop Assessment: no apparent nausea or vomiting Anesthetic complications: no    Last Vitals:  Vitals:   01/29/19 1145 01/29/19 1200  BP: (!) 151/92 140/90  Pulse: 66 64  Resp: 16 (!) 9  Temp:    SpO2: 97% 92%    Last Pain:  Vitals:   01/29/19 1200  PainSc: 2                  Islah Eve A.

## 2019-01-29 NOTE — H&P (Signed)
  Catherine Odonnell is an 53 y.o. female.   Chief Complaint: numbness left hand HPI: [name] is a 53 year old right-hand-dominant female comes in with a complaint of numbness and tingling all fingers. This been going approximately 6 months getting worse. Left slightly greater than right. This awakens her 7 out of 7 nights. She has no history of injury to the hand or to the neck. She states nothing seems to make it better or worse for her. She has tried taking Aleve without relief. He has tried Biofreeze and a wrist splint which has not helped.She had an injection to her left side which is given her excellent relief.She was referred for nerve conductions. These been done by Dr. Posey Pronto. This reveals bilateral carpal tunnel syndromes. Shows a motor delay of 5.0 bilaterally sensory delay of 61 on the left 610 on the right.   She has borderline diabetic has no history of thyroid problems arthritis or gout. Family history is positive diabetes negative for the remainder. Has some proximal radiation and does not describe it localized well.   Past Medical History:  Diagnosis Date  . Anxiety   . GERD (gastroesophageal reflux disease)   . Hyperlipemia   . Hypertension     Past Surgical History:  Procedure Laterality Date  . [place]    History reviewed. No pertinent family history. Social History:  reports that she has never smoked. She has never used smokeless tobacco. She reports current alcohol use. She reports that she does not use drugs.  Allergies:  Allergies  Allergen Reactions  . Amoxicillin Rash    No medications prior to admission.    No results found for this or any previous visit (from the past 48 hour(s)).  No results found.   Pertinent items are noted in HPI.  Height 5\' 3"  (1.6 m), weight 74.8 kg.  General appearance: alert, cooperative and appears stated age Head: Normocephalic, without obvious abnormality Neck: no JVD Resp: clear to auscultation  bilaterally Cardio: regular rate and rhythm, S1, S2 normal, no murmur, click, rub or gallop GI: soft, non-tender; bowel sounds normal; no masses,  no organomegaly Extremities: numbness left hand Pulses: 2+ and symmetric Skin: Skin color, texture, turgor normal. No rashes or lesions Neurologic: Grossly normal Incision/Wound: na  Assessment/Plan Assessment:  1. Bilateral carpal tunnel syndrome    Plan: Schedule for left carpal tunnel release.  Pre-peri-and postoperative course are discussed along with risk complications.  She is aware there is no guarantee to the surgery the possibility of infection recurrence injury to arteries nerves tendons complete relief symptoms and dystrophy.  She is scheduled for left carpal tunnel release in outpatient under regional anesthesia.   Daryll Brod 01/29/2019, 9:20 AM

## 2019-01-30 ENCOUNTER — Encounter (HOSPITAL_BASED_OUTPATIENT_CLINIC_OR_DEPARTMENT_OTHER): Payer: Self-pay | Admitting: Orthopedic Surgery

## 2019-01-30 NOTE — Addendum Note (Signed)
Addendum  created 01/30/19 0953 by Tawni Millers, CRNA   Charge Capture section accepted

## 2019-05-30 ENCOUNTER — Other Ambulatory Visit: Payer: Self-pay

## 2019-05-30 ENCOUNTER — Telehealth: Payer: BC Managed Care – PPO | Admitting: Physician Assistant

## 2019-05-30 DIAGNOSIS — Z20822 Contact with and (suspected) exposure to covid-19: Secondary | ICD-10-CM

## 2019-05-30 DIAGNOSIS — Z20828 Contact with and (suspected) exposure to other viral communicable diseases: Secondary | ICD-10-CM

## 2019-05-30 NOTE — Progress Notes (Signed)
I have spent 5 minutes in review of e-visit questionnaire, review and updating patient chart, medical decision making and response to patient.   Ahmaud Duthie Cody Kahliyah Dick, PA-C    

## 2019-05-30 NOTE — Progress Notes (Signed)
E-Visit for [employer] Virus Screening   Giving recent exposure 4+ days ago it is reasonable for you to quarantine yourself for 14 days or at least until you are tested for COVID as long as you have a negative test and remain asymptomatic. Many health care providers can now test patients at their office but not all are.  [facility] has multiple testing sites. For information on our COVID testing locations and hours go to [email or web address]. Then you can go to one of the testing sites at your earliest convenience. Please quarantine yourself while awaiting your test results.  We are enrolling you in our MyChart Home Montioring for COVID19 . Daily you will receive a questionnaire within the MyChart website. Our COVID 19 response team willl be monitoriing your responses daily.    COVID-19 is a respiratory illness with symptoms that are similar to the flu. Symptoms are typically mild to moderate, but there have been cases of severe illness and death due to the virus. The following symptoms may appear 2-14 days after exposure: . Fever . Cough . Shortness of breath or difficulty breathing . Chills . Repeated shaking with chills . Muscle pain . Headache . Sore throat . New loss of taste or smell . Fatigue . Congestion or runny nose . Nausea or vomiting . Diarrhea  It is vitally important that if you feel that you have an infection such as this virus or any other virus that you stay home and away from places where you may spread it to others.  You should self-quarantine for 14 days if you have symptoms that could potentially be coronavirus or have been in close contact a with a person diagnosed with COVID-19 within the last 2 weeks. You should avoid contact with people age 69 and older.   You should wear a mask or cloth face covering over your nose and mouth if you must be around other people or animals, including pets (even at home). Try to stay at least 6 feet away from  other people. This will protect the people around you.   You may also take acetaminophen (Tylenol) as needed for fever.   Reduce your risk of any infection by using the same precautions used for avoiding the common cold or flu:  Marland Kitchen Wash your hands often with soap and warm water for at least 20 seconds.  If soap and water are not readily available, use an alcohol-based hand sanitizer with at least 60% alcohol.  . If coughing or sneezing, cover your mouth and nose by coughing or sneezing into the elbow areas of your shirt or coat, into a tissue or into your sleeve (not your hands). . Avoid shaking hands with others and consider head nods or verbal greetings only. . Avoid touching your eyes, nose, or mouth with unwashed hands.  . Avoid close contact with people who are sick. . Avoid places or events with large numbers of people in one location, like concerts or sporting events. . Carefully consider travel plans you have or are making. . If you are planning any travel outside or inside the [place], visit the CDC's Travelers' Health webpage for the latest health notices. . If you have some symptoms but not all symptoms, continue to monitor at home and seek medical attention if your symptoms worsen. . If you are having a medical emergency, call 911.  HOME CARE . Only take medications as instructed by your medical team. . Drink plenty of fluids and get plenty of rest. .  A steam or ultrasonic humidifier can help if you have congestion.   GET HELP RIGHT AWAY IF YOU HAVE EMERGENCY WARNING SIGNS** FOR COVID-19. If you or someone is showing any of these signs seek emergency medical care immediately. Call 911 or proceed to your closest emergency facility if: . You develop worsening high fever. . Trouble breathing . Bluish lips or face . Persistent pain or pressure in the chest . New confusion . Inability to wake or stay awake . You cough up blood. . Your symptoms become more severe  **This list is not  all possible symptoms. Contact your medical provider for any symptoms that are sever or concerning to you.   MAKE SURE YOU   Understand these instructions.  Will watch your condition.  Will get help right away if you are not doing well or get worse.  Your e-visit answers were reviewed by a board certified advanced clinical practitioner to complete your personal care plan.  Depending on the condition, your plan could have included both over the counter or prescription medications.  If there is a problem please reply once you have received a response from your provider.  Your safety is important to [place].  If you have drug allergies check your prescription carefully.    You can use MyChart to ask questions about today's visit, request a non-urgent call back, or ask for a work or school excuse for 24 hours related to this e-Visit. If it has been greater than 24 hours you will need to follow up with your provider, or enter a new e-Visit to address those concerns. You will get an e-mail in the next two days asking about your experience.  I hope that your e-visit has been valuable and will speed your recovery. Thank you for using e-visits.

## 2019-06-01 LAB — NOVEL CORONAVIRUS, NAA: SARS-CoV-2, NAA: NOT DETECTED

## 2019-08-06 ENCOUNTER — Other Ambulatory Visit: Payer: Self-pay | Admitting: Orthopedic Surgery

## 2019-08-12 ENCOUNTER — Encounter (HOSPITAL_BASED_OUTPATIENT_CLINIC_OR_DEPARTMENT_OTHER): Payer: Self-pay | Admitting: Orthopedic Surgery

## 2019-08-12 ENCOUNTER — Other Ambulatory Visit: Payer: Self-pay

## 2019-08-14 NOTE — Progress Notes (Signed)
Attempted to contact patient to schedule pre-procedure COVID appointment, no answer, left message to call back

## 2019-08-22 ENCOUNTER — Other Ambulatory Visit: Payer: Self-pay

## 2019-08-22 ENCOUNTER — Encounter (HOSPITAL_BASED_OUTPATIENT_CLINIC_OR_DEPARTMENT_OTHER)
Admission: RE | Admit: 2019-08-22 | Discharge: 2019-08-22 | Disposition: A | Discharge status: Home / Self Care | Payer: 59 | Source: Ambulatory Visit | Attending: Orthopedic Surgery | Admitting: Orthopedic Surgery

## 2019-08-22 DIAGNOSIS — G5601 Carpal tunnel syndrome, right upper limb: Secondary | ICD-10-CM | POA: Diagnosis present

## 2019-08-22 DIAGNOSIS — M654 Radial styloid tenosynovitis [de Quervain]: Secondary | ICD-10-CM | POA: Diagnosis not present

## 2019-08-22 DIAGNOSIS — I10 Essential (primary) hypertension: Secondary | ICD-10-CM | POA: Diagnosis not present

## 2019-08-22 LAB — BASIC METABOLIC PANEL
Anion gap: 12 (ref 5–15)
BUN: 14 mg/dL (ref 6–20)
CO2: 25 mmol/L (ref 22–32)
Calcium: 9.3 mg/dL (ref 8.9–10.3)
Chloride: 102 mmol/L (ref 98–111)
Creatinine, Ser: 0.61 mg/dL (ref 0.44–1.00)
GFR calc Af Amer: >60 mL/min (ref >60)
GFR calc non Af Amer: >60 mL/min (ref >60)
Glucose, Bld: 105 mg/dL — ABNORMAL HIGH (ref 70–99)
Potassium: 3.6 mmol/L (ref 3.5–5.1)
Sodium: 139 mmol/L (ref 135–145)

## 2019-08-22 NOTE — Progress Notes (Signed)

## 2019-08-24 ENCOUNTER — Other Ambulatory Visit (HOSPITAL_COMMUNITY)
Admission: RE | Admit: 2019-08-24 | Discharge: 2019-08-24 | Disposition: A | Discharge status: Home / Self Care | Payer: 59 | Source: Ambulatory Visit | Attending: Orthopedic Surgery | Admitting: Orthopedic Surgery

## 2019-08-24 DIAGNOSIS — Z01812 Encounter for preprocedural laboratory examination: Secondary | ICD-10-CM | POA: Insufficient documentation

## 2019-08-24 DIAGNOSIS — Z20822 Contact with and (suspected) exposure to covid-19: Secondary | ICD-10-CM | POA: Insufficient documentation

## 2019-08-24 LAB — SARS CORONAVIRUS 2 (TAT 6-24 HRS): SARS Coronavirus 2: NEGATIVE

## 2019-08-27 ENCOUNTER — Encounter (HOSPITAL_BASED_OUTPATIENT_CLINIC_OR_DEPARTMENT_OTHER)
Admission: RE | Disposition: A | Discharge status: Home / Self Care | Payer: Self-pay | Source: Home / Self Care | Attending: Orthopedic Surgery

## 2019-08-27 ENCOUNTER — Encounter (HOSPITAL_BASED_OUTPATIENT_CLINIC_OR_DEPARTMENT_OTHER): Payer: Self-pay | Admitting: Orthopedic Surgery

## 2019-08-27 ENCOUNTER — Ambulatory Visit (HOSPITAL_BASED_OUTPATIENT_CLINIC_OR_DEPARTMENT_OTHER): Payer: 59 | Admitting: Anesthesiology

## 2019-08-27 ENCOUNTER — Ambulatory Visit (HOSPITAL_BASED_OUTPATIENT_CLINIC_OR_DEPARTMENT_OTHER)
Admission: RE | Admit: 2019-08-27 | Discharge: 2019-08-27 | Disposition: A | Discharge status: Home / Self Care | Payer: 59 | Attending: Orthopedic Surgery | Admitting: Orthopedic Surgery

## 2019-08-27 ENCOUNTER — Other Ambulatory Visit: Payer: Self-pay

## 2019-08-27 DIAGNOSIS — I10 Essential (primary) hypertension: Secondary | ICD-10-CM | POA: Insufficient documentation

## 2019-08-27 DIAGNOSIS — G5601 Carpal tunnel syndrome, right upper limb: Secondary | ICD-10-CM | POA: Diagnosis not present

## 2019-08-27 DIAGNOSIS — M654 Radial styloid tenosynovitis [de Quervain]: Secondary | ICD-10-CM | POA: Insufficient documentation

## 2019-08-27 HISTORY — PX: DORSAL COMPARTMENT RELEASE: SHX5039

## 2019-08-27 HISTORY — PX: CARPAL TUNNEL RELEASE: SHX101

## 2019-08-27 SURGERY — CARPAL TUNNEL RELEASE
Anesthesia: Monitor Anesthesia Care | Site: Wrist | Laterality: Right

## 2019-08-27 MED ORDER — FENTANYL CITRATE (PF) 100 MCG/2ML IJ SOLN
INTRAMUSCULAR | Status: AC
  Filled 2019-08-27: qty 2

## 2019-08-27 MED ORDER — PROPOFOL 500 MG/50ML IV EMUL
INTRAVENOUS | Status: DC | PRN
  Administered 2019-08-27: 75 ug/kg/min via INTRAVENOUS

## 2019-08-27 MED ORDER — DEXAMETHASONE SODIUM PHOSPHATE 10 MG/ML IJ SOLN
INTRAMUSCULAR | Status: DC | PRN
  Administered 2019-08-27: 5 mg via INTRAVENOUS

## 2019-08-27 MED ORDER — PROPOFOL 500 MG/50ML IV EMUL
INTRAVENOUS | Status: AC
  Filled 2019-08-27: qty 150

## 2019-08-27 MED ORDER — OXYCODONE HCL 5 MG/5ML PO SOLN: 5.0000 mg | Freq: Once | ORAL | Status: AC | PRN

## 2019-08-27 MED ORDER — LACTATED RINGERS IV SOLN: INTRAVENOUS | Status: DC

## 2019-08-27 MED ORDER — ONDANSETRON HCL 4 MG/2ML IJ SOLN
INTRAMUSCULAR | Status: DC | PRN
  Administered 2019-08-27: 4 mg via INTRAVENOUS

## 2019-08-27 MED ORDER — KETOROLAC TROMETHAMINE 30 MG/ML IJ SOLN: 30.0000 mg | Freq: Once | INTRAMUSCULAR | Status: DC | PRN

## 2019-08-27 MED ORDER — FENTANYL CITRATE (PF) 100 MCG/2ML IJ SOLN
25.0000 ug | INTRAMUSCULAR | Status: DC | PRN
  Administered 2019-08-27: 11:00:00 50 ug via INTRAVENOUS

## 2019-08-27 MED ORDER — FENTANYL CITRATE (PF) 100 MCG/2ML IJ SOLN
50.0000 ug | INTRAMUSCULAR | Status: AC | PRN
  Administered 2019-08-27 (×3): 25 ug via INTRAVENOUS
  Administered 2019-08-27: 50 ug via INTRAVENOUS

## 2019-08-27 MED ORDER — CHLORHEXIDINE GLUCONATE 4 % EX LIQD: 60.0000 mL | Freq: Once | CUTANEOUS | Status: DC

## 2019-08-27 MED ORDER — OXYCODONE HCL 5 MG PO TABS
5.0000 mg | ORAL_TABLET | Freq: Once | ORAL | Status: AC | PRN
  Administered 2019-08-27: 11:00:00 5 mg via ORAL

## 2019-08-27 MED ORDER — LIDOCAINE HCL (PF) 0.5 % IJ SOLN
INTRAMUSCULAR | Status: DC | PRN
  Administered 2019-08-27: 35 mL via INTRAVENOUS

## 2019-08-27 MED ORDER — VANCOMYCIN HCL IN DEXTROSE 1-5 GM/200ML-% IV SOLN
1000.0000 mg | INTRAVENOUS | Status: AC
  Administered 2019-08-27: 1000 mg via INTRAVENOUS

## 2019-08-27 MED ORDER — OXYCODONE HCL 5 MG PO TABS
ORAL_TABLET | ORAL | Status: AC
  Filled 2019-08-27: qty 1

## 2019-08-27 MED ORDER — VANCOMYCIN HCL IN DEXTROSE 1-5 GM/200ML-% IV SOLN
INTRAVENOUS | Status: AC
  Filled 2019-08-27: qty 200

## 2019-08-27 MED ORDER — ONDANSETRON HCL 4 MG/2ML IJ SOLN
INTRAMUSCULAR | Status: AC
  Filled 2019-08-27: qty 2

## 2019-08-27 MED ORDER — MIDAZOLAM HCL 2 MG/2ML IJ SOLN
INTRAMUSCULAR | Status: AC
  Filled 2019-08-27: qty 2

## 2019-08-27 MED ORDER — 0.9 % SODIUM CHLORIDE (POUR BTL) OPTIME
TOPICAL | Status: DC | PRN
  Administered 2019-08-27: 10:00:00 1000 mL

## 2019-08-27 MED ORDER — BUPIVACAINE HCL (PF) 0.25 % IJ SOLN
INTRAMUSCULAR | Status: DC | PRN
  Administered 2019-08-27: 1 mL

## 2019-08-27 MED ORDER — LIDOCAINE 2% (20 MG/ML) 5 ML SYRINGE
INTRAMUSCULAR | Status: AC
  Filled 2019-08-27: qty 5

## 2019-08-27 MED ORDER — ONDANSETRON HCL 4 MG/2ML IJ SOLN: 4.0000 mg | Freq: Once | INTRAMUSCULAR | Status: DC | PRN

## 2019-08-27 MED ORDER — MEPERIDINE HCL 25 MG/ML IJ SOLN: 6.2500 mg | INTRAMUSCULAR | Status: DC | PRN

## 2019-08-27 MED ORDER — KETOROLAC TROMETHAMINE 30 MG/ML IJ SOLN
INTRAMUSCULAR | Status: AC
  Filled 2019-08-27: qty 1

## 2019-08-27 MED ORDER — DEXAMETHASONE SODIUM PHOSPHATE 10 MG/ML IJ SOLN
INTRAMUSCULAR | Status: AC
  Filled 2019-08-27: qty 1

## 2019-08-27 MED ORDER — TRAMADOL HCL 50 MG PO TABS: 50.0000 mg | ORAL_TABLET | Freq: Four times a day (QID) | ORAL | 0 refills | Status: DC | PRN

## 2019-08-27 MED ORDER — ACETAMINOPHEN 160 MG/5ML PO SOLN: 325.0000 mg | ORAL | Status: DC | PRN

## 2019-08-27 MED ORDER — ACETAMINOPHEN 325 MG PO TABS: 325.0000 mg | ORAL_TABLET | ORAL | Status: DC | PRN

## 2019-08-27 MED ORDER — MIDAZOLAM HCL 2 MG/2ML IJ SOLN
1.0000 mg | INTRAMUSCULAR | Status: DC | PRN
  Administered 2019-08-27: 2 mg via INTRAVENOUS

## 2019-08-27 SURGICAL SUPPLY — 50 items
APL PRP STRL LF DISP 70% ISPRP (MISCELLANEOUS) ×1
BLADE SURG 15 STRL LF DISP TIS (BLADE) ×1 IMPLANT
BLADE SURG 15 STRL SS (BLADE) ×3
BNDG CMPR 9X4 STRL LF SNTH (GAUZE/BANDAGES/DRESSINGS)
BNDG COHESIVE 3X5 TAN STRL LF (GAUZE/BANDAGES/DRESSINGS) ×3 IMPLANT
BNDG COHESIVE 4X5 TAN STRL (GAUZE/BANDAGES/DRESSINGS) ×2 IMPLANT
BNDG ESMARK 4X9 LF (GAUZE/BANDAGES/DRESSINGS) IMPLANT
BNDG GAUZE ELAST 4 BULKY (GAUZE/BANDAGES/DRESSINGS) ×3 IMPLANT
BNDG PLASTER X FAST 3X3 WHT LF (CAST SUPPLIES) ×2 IMPLANT
BNDG PLSTR 9X3 FST ST WHT (CAST SUPPLIES) ×1
CHLORAPREP W/TINT 26 (MISCELLANEOUS) ×3 IMPLANT
CORD BIPOLAR FORCEPS 12FT (ELECTRODE) ×3 IMPLANT
COVER BACK TABLE REUSABLE LG (DRAPES) ×3 IMPLANT
COVER MAYO STAND REUSABLE (DRAPES) ×3 IMPLANT
COVER WAND RF STERILE (DRAPES) IMPLANT
CUFF TOURN SGL QUICK 18X4 (TOURNIQUET CUFF) ×3 IMPLANT
DECANTER SPIKE VIAL GLASS SM (MISCELLANEOUS) IMPLANT
DRAPE EXTREMITY T 121X128X90 (DISPOSABLE) ×3 IMPLANT
DRAPE SURG 17X23 STRL (DRAPES) ×3 IMPLANT
DRSG PAD ABDOMINAL 8X10 ST (GAUZE/BANDAGES/DRESSINGS) ×3 IMPLANT
GAUZE SPONGE 4X4 12PLY STRL (GAUZE/BANDAGES/DRESSINGS) ×3 IMPLANT
GAUZE SPONGE 4X4 12PLY STRL LF (GAUZE/BANDAGES/DRESSINGS) ×2 IMPLANT
GAUZE XEROFORM 1X8 LF (GAUZE/BANDAGES/DRESSINGS) ×3 IMPLANT
GLOVE BIO SURGEON STRL SZ7.5 (GLOVE) ×4 IMPLANT
GLOVE BIOGEL PI IND STRL 8.5 (GLOVE) ×1 IMPLANT
GLOVE BIOGEL PI INDICATOR 8.5 (GLOVE) ×2
GLOVE SURG ORTHO 8.0 STRL STRW (GLOVE) ×5 IMPLANT
GOWN STRL REUS W/ TWL LRG LVL3 (GOWN DISPOSABLE) ×1 IMPLANT
GOWN STRL REUS W/TWL LRG LVL3 (GOWN DISPOSABLE) ×3
GOWN STRL REUS W/TWL XL LVL3 (GOWN DISPOSABLE) ×3 IMPLANT
NDL PRECISIONGLIDE 27X1.5 (NEEDLE) IMPLANT
NEEDLE PRECISIONGLIDE 27X1.5 (NEEDLE) IMPLANT
NS IRRIG 1000ML POUR BTL (IV SOLUTION) ×3 IMPLANT
PACK BASIN DAY SURGERY FS (CUSTOM PROCEDURE TRAY) ×3 IMPLANT
PAD CAST 3X4 CTTN HI CHSV (CAST SUPPLIES) ×1 IMPLANT
PAD CAST 4YDX4 CTTN HI CHSV (CAST SUPPLIES) IMPLANT
PADDING CAST ABS 4INX4YD NS (CAST SUPPLIES) ×2
PADDING CAST ABS COTTON 4X4 ST (CAST SUPPLIES) ×1 IMPLANT
PADDING CAST COTTON 3X4 STRL (CAST SUPPLIES) ×3
PADDING CAST COTTON 4X4 STRL (CAST SUPPLIES) ×3
SPLINT PLASTER CAST XFAST 3X15 (CAST SUPPLIES) IMPLANT
SPLINT PLASTER XTRA FASTSET 3X (CAST SUPPLIES)
STOCKINETTE 4X48 STRL (DRAPES) ×3 IMPLANT
SUT ETHILON 4 0 PS 2 18 (SUTURE) ×3 IMPLANT
SUT VIC AB 4-0 P2 18 (SUTURE) IMPLANT
SUT VICRYL 4-0 PS2 18IN ABS (SUTURE) IMPLANT
SYR BULB 3OZ (MISCELLANEOUS) ×3 IMPLANT
SYR CONTROL 10ML LL (SYRINGE) IMPLANT
TOWEL GREEN STERILE FF (TOWEL DISPOSABLE) ×6 IMPLANT
UNDERPAD 30X36 HEAVY ABSORB (UNDERPADS AND DIAPERS) ×3 IMPLANT

## 2019-08-27 NOTE — Transfer of Care (Signed)
Immediate Anesthesia Transfer of Care Note  Patient: Catherine Odonnell  Procedure(s) Performed: RIGHT CARPAL TUNNEL RELEASE (Right Wrist) RIGHT WRIST RELEASE DORSAL COMPARTMENT (DEQUERVAIN) (Right Wrist)  Patient Location: PACU  Anesthesia Type:MAC and Bier block  Level of Consciousness: drowsy and patient cooperative  Airway & Oxygen Therapy: Patient Spontanous Breathing and Patient connected to face mask oxygen  Post-op Assessment: Report given to RN and Post -op Vital signs reviewed and stable  Post vital signs: Reviewed and stable  Last Vitals:  Vitals Value Taken Time  BP 167/115 08/27/19 1024  Temp    Pulse 88 08/27/19 1028  Resp 17 08/27/19 1028  SpO2 100 % 08/27/19 1028  Vitals shown include unvalidated device data.  Last Pain:  Vitals:   08/27/19 0858  TempSrc: Temporal  PainSc: 5       Patients Stated Pain Goal: 5 (83/29/19 1660)  Complications: No apparent anesthesia complications

## 2019-08-27 NOTE — Brief Op Note (Signed)
08/27/2019  10:28 AM  PATIENT:  Catherine Odonnell  54 y.o. female  PRE-OPERATIVE DIAGNOSIS:  RIGHT CARPAL TUNNEL SYNDROME, DEQUERVAINS RIGHT WRIST  POST-OPERATIVE DIAGNOSIS:  RIGHT CARPAL TUNNEL SYNDROME, DEQUERVAINS RIGHT WRIST  PROCEDURE:  Procedure(s) with comments: RIGHT CARPAL TUNNEL RELEASE (Right) - IV REGIONAL UPPER ARM BLOCK RIGHT WRIST RELEASE DORSAL COMPARTMENT (DEQUERVAIN) (Right)  SURGEON:  Surgeon(s) and Role:    * Cindee Salt, MD - Primary  PHYSICIAN ASSISTANT:   ASSISTANTS: none   ANESTHESIA:   regional and IV sedation  EBL:  33ml   BLOOD ADMINISTERED:none  DRAINS: none   LOCAL MEDICATIONS USED:  NONE  SPECIMEN:  No Specimen  DISPOSITION OF SPECIMEN:  N/A  COUNTS:  YES  TOURNIQUET:   Total Tourniquet Time Documented: Forearm (Right) - 40 minutes Total: Forearm (Right) - 40 minutes   DICTATION: .Dragon Dictation  PLAN OF CARE: Discharge to home after PACU  PATIENT DISPOSITION:  PACU - hemodynamically stable.

## 2019-08-27 NOTE — Discharge Instructions (Addendum)

## 2019-08-27 NOTE — H&P (Signed)
  Catherine Odonnell is an 54 y.o. female.   Chief Complaint:numbness and pain right hand HPI: Catherine Odonnell is a 54 yo female who has had trigger finger releases on her left side.  She is having some symptoms of numbness and tingling on her right side. She is status post carpal tunnel release on the left side. She has had nerve conductions done and will follow. These were done on 09/05/2018. This was revealed bilateral carpal tunnel syndrome. Her nerve conductions revealed motor delays of 5.0 bilaterally sensory delays at 6.1 and 6.0 left and right. She states that she has not been bothered by pain on the radial aspect of her right wrist over the radial styloid. She states this began just before her last visit and has gotten worse. She is not taking anything for it. States use makes it worse he continues complain of numbness and tingling. Pain at her wrist is sharp in nature with use of her thumb.   Past Medical History:  Diagnosis Date  . Anxiety   . GERD (gastroesophageal reflux disease)   . Hyperlipemia   . Hypertension     Past Surgical History:  Procedure Laterality Date  . CARPAL TUNNEL RELEASE Left 01/29/2019   Procedure: LEFT CARPAL TUNNEL RELEASE;  Surgeon: Cindee Salt, MD;  Location: [facility];  Service: Orthopedics;  Laterality: Left;  . CESAREAN SECTION  1999    History reviewed. No pertinent family history. Social History:  reports that she has never smoked. She has never used smokeless tobacco. She reports current alcohol use. She reports that she does not use drugs.  Allergies:  Allergies  Allergen Reactions  . Amoxicillin Rash    No medications prior to admission.    No results found for this or any previous visit (from the past 48 hour(s)).  No results found.   Pertinent items are noted in HPI.  Height 5\' 3"  (1.6 m), weight 76.2 kg.  General appearance: alert, cooperative and appears stated age Head: Normocephalic, without obvious abnormality Neck: no  JVD Resp: clear to auscultation bilaterally Cardio: regular rate and rhythm, S1, S2 normal, no murmur, click, rub or gallop GI: soft, non-tender; bowel sounds normal; no masses,  no organomegaly Extremities: numbness hand and pain right wrist Pulses: 2+ and symmetric Skin: Skin color, texture, turgor normal. No rashes or lesions Neurologic: Grossly normal Incision/Wound: na  Assessment/Plan Diagnosis carpal tunnel syndrome right hand and de Quervain's tendinitis right wrist  Plan: We have discussed release of the carpal canal and release first dorsal extensor compartment at the same time with her. We have discussed the possibility of conservative treatment for the de Quervain's and the possibility of surgical intervention should several injections not resolve this for her. She has elected to proceed to have the carpal tunnel and first dorsal extensor compartment release. She is aware that there is no guarantee to the surgery the possibility of infection recurrence injury to arteries nerves tendons complete relief symptoms dystrophy. She is scheduled for carpal tunnel release and release de Quervain's tendinitis right wrist as an outpatient under regional anesthesia.   08/27/2019, 5:49 AM

## 2019-08-27 NOTE — Anesthesia Preprocedure Evaluation (Signed)
Anesthesia Evaluation  Patient identified by MRN, date of birth, ID band Patient awake    Reviewed: Allergy & Precautions, NPO status , Patient's Chart, lab work & pertinent test results  Airway Mallampati: I  TM Distance: >3 FB Neck ROM: Full    Dental no notable dental hx. (+) Teeth Intact   Pulmonary neg pulmonary ROS,    Pulmonary exam normal breath sounds clear to auscultation       Cardiovascular hypertension, Pt. on medications Normal cardiovascular exam Rhythm:Regular Rate:Normal     Neuro/Psych Anxiety Left Carpal Tunnel Syndrome    GI/Hepatic Neg liver ROS, GERD  Medicated and Controlled,  Endo/Other  negative endocrine ROSHyperlipidemia  Renal/GU negative Renal ROS  negative genitourinary   Musculoskeletal negative musculoskeletal ROS (+)   Abdominal (+) + obese,   Peds  Hematology negative hematology ROS (+)   Anesthesia Other Findings   Reproductive/Obstetrics                             Anesthesia Physical  Anesthesia Plan  ASA: II  Anesthesia Plan: Bier Block and MAC and Bier Block-LIDOCAINE ONLY   Post-op Pain Management:    Induction: Intravenous  PONV Risk Score and Plan: 2 and Ondansetron, Propofol infusion and Treatment may vary due to age or medical condition  Airway Management Planned: Natural Airway, Nasal Cannula and Simple Face Mask  Additional Equipment: None  Intra-op Plan:   Post-operative Plan:   Informed Consent: I have reviewed the patients History and Physical, chart, labs and discussed the procedure including the risks, benefits and alternatives for the proposed anesthesia with the patient or authorized representative who has indicated his/her understanding and acceptance.       Plan Discussed with: CRNA  Anesthesia Plan Comments:         Anesthesia Quick Evaluation

## 2019-08-27 NOTE — Anesthesia Postprocedure Evaluation (Signed)
Anesthesia Post Note  Patient: Catherine Odonnell  Procedure(s) Performed: RIGHT CARPAL TUNNEL RELEASE (Right Wrist) RIGHT WRIST RELEASE DORSAL COMPARTMENT (DEQUERVAIN) (Right Wrist)     Patient location during evaluation: Phase II Anesthesia Type: MAC Level of consciousness: sedated and awake Pain management: pain level controlled Vital Signs Assessment: post-procedure vital signs reviewed and stable Respiratory status: spontaneous breathing Cardiovascular status: stable Postop Assessment: no apparent nausea or vomiting Anesthetic complications: no    Last Vitals:  Vitals:   08/27/19 1100 08/27/19 1115  BP: 119/85 (!) 166/94  Pulse: 67 74  Resp: 12 16  Temp:  36.9 C  SpO2: 93% 95%    Last Pain:  Vitals:   08/27/19 1100  TempSrc:   PainSc: Asleep   Pain Goal: Patients Stated Pain Goal: 5 (08/27/19 0858)                 Huston Foley

## 2019-08-27 NOTE — Op Note (Signed)
NAME: Catherine Odonnell MEDICAL RECORD NO: [id number] DATE OF BIRTH: 1966/07/29 FACILITY: [facility] LOCATION: [facility] PHYSICIAN: Wynonia Sours, MD   OPERATIVE REPORT   DATE OF PROCEDURE: 08/27/19    PREOPERATIVE DIAGNOSIS:   Carpal tunnel syndrome right hand de Quervain's tendinitis right wrist   POSTOPERATIVE DIAGNOSIS:   Same   PROCEDURE:   Decompression median nerve right hand release first dorsal extensor compartment tendons right wrist   SURGEON: Daryll Brod, M.D.   ASSISTANT: none   ANESTHESIA:  Bier block with sedation   INTRAVENOUS FLUIDS:  Per anesthesia flow sheet.   ESTIMATED BLOOD LOSS:  Minimal.   COMPLICATIONS:  None.   SPECIMENS:  none   TOURNIQUET TIME:    Total Tourniquet Time Documented: Forearm (Right) - 40 minutes Total: Forearm (Right) - 40 minutes    DISPOSITION:  Stable to PACU.   INDICATIONS: Patient is a 54 year old female with numbness and tingling of right hand pain in the radial aspect of her right wrist this not responded to conservative treatment.  She is elected to undergo surgical release of the first dorsal extensor compartment and carpal canal.  Nerve conductions are positive revealing carpal tunnel syndrome.  Preperi-and postoperative course been discussed along with risks and complications.  She is aware that there is no guarantee to the surgery the possibility of infection recurrence injury to arteries nerves tendons complete relief symptoms and dystrophy.  In the preoperative area the patient seen the extremity marked by both patient and surgeon antibiotic given  OPERATIVE COURSE: Patient is brought to the operating room where form based IV regional anesthetic was carried out without difficulty.  She was prepped using ChloraPrep in the supine position with the right arm free.  3-minute dry time was allowed timeout taken confirming patient procedure.  The first dorsal extensor compartment tendons were addressed first  longitudinal incision was made directly over the first dorsal extensor compartment carried down through subcutaneous tissue.  Bleeders were electrocauterized with bipolar.  The radial nerve was identified protected.  A very thickened sensory retinaculum was immediately encountered.  This was released on its dorsal aspect revealing the extensor pollicis brevis tendon.  A septum was present between the extensor brevis and the abductor longus.  This was released.  The tendons were both fully released.  Thumb placed through a full range of motion easy clotting was afforded.  The wound was copiously irrigated with saline.  The subcutaneous tissue was closed erupted 4-0 Vicryl and skin with interrupted 4-0 nylon sutures.  A separate incision was then made longitudinally in the right palm carried down through subcutaneous tissue.  Bleeders were electrocauterized with bipolar.  The palmar fascia was split.  The superficial palmar arch was identified.  Flexor tendon of the ring little finger was identified.  Retractors were placed retracting the median nerve flexor tendons radially and the ulnar nerve ulnarly.  The flexor retinaculum was then released on its ulnar border.  Right angle and stool retractor placed between skin and forearm fascia.  Deep structures were dissected free with blunt dissection.  Blunt scissors were then used to release the proximal aspect of the flexor retinaculum distal forearm fascia for approximately 3 cm proximal to the wrist crease under direct vision.  The canal was explored.  An area compression of the nerve was apparent.  Motor branch entered muscle distally.  The wound was copiously irrigated with saline.  The skin was closed erupted 4 nylon sutures.  A sterile compressive dressing and  thumb spica splint was applied.  Deflation of the tourniquet all fingers immediately pink.  She was taken to recovery room for observation in satisfactory condition.  She will be discharged home to return  [facility] in 1 week on Tylenol ibuprofen for pain with Ultram for backup as a breakthrough.   Cindee Salt, MD Electronically signed, 08/27/19

## 2019-08-28 ENCOUNTER — Encounter: Payer: Self-pay | Admitting: *Deleted

## 2019-08-28 NOTE — Addendum Note (Signed)
Addendum  created 08/28/19 1207 by Lance Coon, CRNA   Charge Capture section accepted

## 2022-04-18 ENCOUNTER — Other Ambulatory Visit: Payer: Self-pay | Admitting: Urology

## 2022-05-30 ENCOUNTER — Other Ambulatory Visit: Payer: Self-pay

## 2022-05-30 MED ORDER — BD PEN NEEDLE NANO 2ND GEN 32G X 4 MM MISC: 3 refills | Status: DC

## 2022-05-30 MED ORDER — SAXENDA 18 MG/3ML ~~LOC~~ SOPN
PEN_INJECTOR | SUBCUTANEOUS | 0 refills | Status: DC
  Filled 2022-06-13: qty 15, 34d supply, fill #0

## 2022-05-30 MED ORDER — SAXENDA 18 MG/3ML ~~LOC~~ SOPN: PEN_INJECTOR | SUBCUTANEOUS | 0 refills | Status: DC

## 2022-05-30 MED ORDER — BD PEN NEEDLE NANO 2ND GEN 32G X 4 MM MISC
3 refills | Status: AC
  Filled 2022-06-13 – 2022-06-27 (×2): qty 100, 100d supply, fill #0
  Filled 2022-10-17: qty 100, 100d supply, fill #1

## 2022-06-02 NOTE — Progress Notes (Addendum)
Anesthesia Review:  PCP Antony Haste  Cardiologist : none  Chest x-ray : EKG : 06/07/22  Echo : Stress test: Cardiac Cath :  Activity level: can do a flight of stairs without difficulty  Sleep Study/ CPAP : none  Fasting Blood Sugar :      / Checks Blood Sugar -- times a day:   Blood Thinner/ Instructions /Last Dose: ASA / Instructions/ Last Dose :   Verified with pt that she has not started the Saxenda ( for weight loss)  and will not start until after procedure on 06/23/22 Prediabetes- does not hceck glucose at home  Hgba1c- 06/07/22-  5.7  BMP done 06/07/22 routed to DR Marlou Porch.  Shanda Bumps [facility] aware.   PT was almost 20 minutes late for preop appt.

## 2022-06-02 NOTE — Patient Instructions (Signed)
SURGICAL WAITING ROOM VISITATION Patients having surgery or a procedure may have no more than 2 support people in the waiting area - these visitors may rotate.   Children under the age of 36 must have an adult with them who is not the patient. If the patient needs to stay at the hospital during part of their recovery, the visitor guidelines for inpatient rooms apply. Pre-op nurse will coordinate an appropriate time for 1 support person to accompany patient in pre-op.  This support person may not rotate.    Please refer to the [facility] website for the visitor guidelines for Inpatients (after your surgery is over and you are in a regular room).       Your procedure is scheduled on:  06/23/2022    Report to [facility] Main Entrance    Report to admitting at  [place] AM   Call this number if you have problems the morning of surgery [phone]   Do not eat food :After Midnight.   After Midnight you may have the following liquids until ___ 0430___ AM  DAY OF SURGERY  Water Non-Citrus Juices (without pulp, NO RED) Carbonated Beverages Black Coffee (NO MILK/CREAM OR CREAMERS, sugar ok)  Clear Tea (NO MILK/CREAM OR CREAMERS, sugar ok) regular and decaf                             Plain Jell-O (NO RED)                                           Fruit ices (not with fruit pulp, NO RED)                                     Popsicles (NO RED)                                                               Sports drinks like Gatorade (NO RED)                           If you have questions, please contact your surgeon's office.       Oral Hygiene is also important to reduce your risk of infection.                                    Remember - BRUSH YOUR TEETH THE MORNING OF SURGERY WITH YOUR REGULAR TOOTHPASTE   Do NOT smoke after Midnight   Take these medicines the morning of surgery with A SIP OF WATER:  pristiq   DO NOT TAKE ANY ORAL DIABETIC MEDICATIONS DAY OF YOUR  SURGERY  Bring CPAP mask and tubing day of surgery.                              You may not have any metal on your body including hair pins, jewelry, and body piercing  Do not wear make-up, lotions, powders, perfumes/cologne, or deodorant  Do not wear nail polish including gel and S&S, artificial/acrylic nails, or any other type of covering on natural nails including finger and toenails. If you have artificial nails, gel coating, etc. that needs to be removed by a nail salon please have this removed prior to surgery or surgery may need to be canceled/ delayed if the surgeon/ anesthesia feels like they are unable to be safely monitored.   Do not shave  48 hours prior to surgery.               Men may shave face and neck.   Do not bring valuables to the hospital. [place].   Contacts, dentures or bridgework may not be worn into surgery.   Bring small overnight bag day of surgery.   DO NOT [place]. PHARMACY WILL DISPENSE MEDICATIONS LISTED ON YOUR MEDICATION LIST TO YOU DURING YOUR ADMISSION [place]!    Patients discharged on the day of surgery will not be allowed to drive home.  Someone NEEDS to stay with you for the first 24 hours after anesthesia.   Special Instructions: Bring a copy of your healthcare power of attorney and living will documents the day of surgery if you haven't scanned them before.              Please read over the following fact sheets you were given: IF [place] [phone]   If you received a COVID test during your pre-op visit  it is requested that you wear a mask when out in public, stay away from anyone that may not be feeling well and notify your surgeon if you develop symptoms. If you test positive for Covid or have been in contact with anyone that has tested positive in the last 10 days please notify  you surgeon.     [place] - Preparing for Surgery Before surgery, you can play an important role.  Because skin is not sterile, your skin needs to be as free of germs as possible.  You can reduce the number of germs on your skin by washing with CHG (chlorahexidine gluconate) soap before surgery.  CHG is an antiseptic cleaner which kills germs and bonds with the skin to continue killing germs even after washing. Please DO NOT use if you have an allergy to CHG or antibacterial soaps.  If your skin becomes reddened/irritated stop using the CHG and inform your nurse when you arrive at Short Stay. Do not shave (including legs and underarms) for at least 48 hours prior to the first CHG shower.  You may shave your face/neck. Please follow these instructions carefully:  1.  Shower with CHG Soap the night before surgery and the  morning of Surgery.  2.  If you choose to wash your hair, wash your hair first as usual with your  normal  shampoo.  3.  After you shampoo, rinse your hair and body thoroughly to remove the  shampoo.                           4.  Use CHG as you would any other liquid soap.  You can apply chg directly  to the skin and wash  Gently with a scrungie or clean washcloth.  5.  Apply the CHG Soap to your body ONLY FROM THE NECK DOWN.   Do not use on face/ open                           Wound or open sores. Avoid contact with eyes, ears mouth and genitals (private parts).                       Wash face,  Genitals (private parts) with your normal soap.             6.  Wash thoroughly, paying special attention to the area where your surgery  will be performed.  7.  Thoroughly rinse your body with warm water from the neck down.  8.  DO NOT shower/wash with your normal soap after using and rinsing off  the CHG Soap.                9.  Pat yourself dry with a clean towel.            10.  Wear clean pajamas.            11.  Place clean sheets on your bed the night of your  first shower and do not  sleep with pets. Day of Surgery : Do not apply any lotions/deodorants the morning of surgery.  Please wear clean clothes to the hospital/surgery center.  FAILURE TO FOLLOW THESE INSTRUCTIONS MAY RESULT IN THE CANCELLATION OF YOUR SURGERY PATIENT SIGNATURE_________________________________  NURSE SIGNATURE__________________________________  ________________________________________________________________________

## 2022-06-06 ENCOUNTER — Other Ambulatory Visit: Payer: Self-pay

## 2022-06-07 ENCOUNTER — Encounter (HOSPITAL_COMMUNITY)
Admission: RE | Admit: 2022-06-07 | Discharge: 2022-06-07 | Disposition: A | Discharge status: Home / Self Care | Payer: BC Managed Care – PPO | Source: Ambulatory Visit | Attending: Urology | Admitting: Urology

## 2022-06-07 ENCOUNTER — Other Ambulatory Visit: Payer: Self-pay

## 2022-06-07 ENCOUNTER — Encounter (HOSPITAL_COMMUNITY): Payer: Self-pay

## 2022-06-07 VITALS — BP 139/88 | HR 72 | Temp 98.3°F | Resp 16 | Ht 63.0 in | Wt 172.0 lb

## 2022-06-07 DIAGNOSIS — Z01818 Encounter for other preprocedural examination: Secondary | ICD-10-CM | POA: Insufficient documentation

## 2022-06-07 DIAGNOSIS — R9431 Abnormal electrocardiogram [ECG] [EKG]: Secondary | ICD-10-CM | POA: Insufficient documentation

## 2022-06-07 HISTORY — DX: Personal history of urinary calculi: Z87.442

## 2022-06-07 HISTORY — DX: Prediabetes: R73.03

## 2022-06-07 LAB — BASIC METABOLIC PANEL
Anion gap: 6 (ref 5–15)
BUN: 14 mg/dL (ref 6–20)
CO2: 30 mmol/L (ref 22–32)
Calcium: 8.9 mg/dL (ref 8.9–10.3)
Chloride: 103 mmol/L (ref 98–111)
Creatinine, Ser: 0.62 mg/dL (ref 0.44–1.00)
GFR, Estimated: >60 mL/min (ref >60)
Glucose, Bld: 115 mg/dL — ABNORMAL HIGH (ref 70–99)
Potassium: 3.2 mmol/L — ABNORMAL LOW (ref 3.5–5.1)
Sodium: 139 mmol/L (ref 135–145)

## 2022-06-07 LAB — CBC
HCT: 38 % (ref 36.0–46.0)
Hemoglobin: 12.2 g/dL (ref 12.0–15.0)
MCH: 29.2 pg (ref 26.0–34.0)
MCHC: 32.1 g/dL (ref 30.0–36.0)
MCV: 90.9 fL (ref 80.0–100.0)
Platelets: 357 10*3/uL (ref 150–400)
RBC: 4.18 MIL/uL (ref 3.87–5.11)
RDW: 14.5 % (ref 11.5–15.5)
WBC: 6.2 10*3/uL (ref 4.0–10.5)
nRBC: 0 % (ref 0.0–0.2)

## 2022-06-07 LAB — HEMOGLOBIN A1C
Hgb A1c MFr Bld: 5.7 % — ABNORMAL HIGH (ref 4.8–5.6)
Mean Plasma Glucose: 116.89 mg/dL

## 2022-06-08 LAB — URINE CULTURE: Culture: <10000 — AB

## 2022-06-13 ENCOUNTER — Other Ambulatory Visit: Payer: Self-pay

## 2022-06-14 ENCOUNTER — Other Ambulatory Visit: Payer: Self-pay

## 2022-06-15 ENCOUNTER — Other Ambulatory Visit: Payer: Self-pay

## 2022-06-22 NOTE — Anesthesia Preprocedure Evaluation (Signed)
Anesthesia Evaluation  Patient identified by MRN, date of birth, ID band Patient awake    Reviewed: Allergy & Precautions, NPO status , Patient's Chart, lab work & pertinent test results  Airway Mallampati: II  TM Distance: >3 FB Neck ROM: Full    Dental  (+) Dental Advisory Given, Missing   Pulmonary neg pulmonary ROS   Pulmonary exam normal breath sounds clear to auscultation       Cardiovascular hypertension, Pt. on medications Normal cardiovascular exam Rhythm:Regular Rate:Normal     Neuro/Psych  PSYCHIATRIC DISORDERS Anxiety     negative neurological ROS     GI/Hepatic Neg liver ROS,GERD  Medicated,,  Endo/Other  negative endocrine ROS  Obesity   Renal/GU RIGHT KIDNEY STONE >2CM     Musculoskeletal negative musculoskeletal ROS (+)    Abdominal   Peds  Hematology negative hematology ROS (+)   Anesthesia Other Findings   Reproductive/Obstetrics                             Anesthesia Physical Anesthesia Plan  ASA: 2  Anesthesia Plan: General   Post-op Pain Management: Tylenol PO (pre-op)* and Toradol IV (intra-op)*   Induction: Intravenous  PONV Risk Score and Plan: 4 or greater and Scopolamine patch - Pre-op, Midazolam, Dexamethasone and Ondansetron  Airway Management Planned: Oral ETT  Additional Equipment:   Intra-op Plan:   Post-operative Plan: Extubation in OR  Informed Consent: I have reviewed the patients History and Physical, chart, labs and discussed the procedure including the risks, benefits and alternatives for the proposed anesthesia with the patient or authorized representative who has indicated his/her understanding and acceptance.     Dental advisory given  Plan Discussed with: CRNA  Anesthesia Plan Comments:         Anesthesia Quick Evaluation

## 2022-06-23 ENCOUNTER — Ambulatory Visit (HOSPITAL_COMMUNITY): Payer: BC Managed Care – PPO | Admitting: Anesthesiology

## 2022-06-23 ENCOUNTER — Observation Stay (HOSPITAL_COMMUNITY)
Admission: RE | Admit: 2022-06-23 | Discharge: 2022-06-24 | Disposition: A | Discharge status: Home / Self Care | Payer: BC Managed Care – PPO | Source: Ambulatory Visit | Attending: Urology | Admitting: Urology

## 2022-06-23 ENCOUNTER — Encounter (HOSPITAL_COMMUNITY): Payer: Self-pay | Admitting: Urology

## 2022-06-23 ENCOUNTER — Other Ambulatory Visit: Payer: Self-pay

## 2022-06-23 ENCOUNTER — Ambulatory Visit (HOSPITAL_COMMUNITY): Payer: BC Managed Care – PPO

## 2022-06-23 ENCOUNTER — Ambulatory Visit (HOSPITAL_COMMUNITY): Payer: BC Managed Care – PPO | Admitting: Physician Assistant

## 2022-06-23 ENCOUNTER — Encounter (HOSPITAL_COMMUNITY)
Admission: RE | Disposition: A | Discharge status: Home / Self Care | Payer: Self-pay | Source: Ambulatory Visit | Attending: Urology

## 2022-06-23 DIAGNOSIS — N200 Calculus of kidney: Secondary | ICD-10-CM | POA: Diagnosis present

## 2022-06-23 DIAGNOSIS — I10 Essential (primary) hypertension: Secondary | ICD-10-CM | POA: Diagnosis not present

## 2022-06-23 DIAGNOSIS — E119 Type 2 diabetes mellitus without complications: Secondary | ICD-10-CM | POA: Insufficient documentation

## 2022-06-23 DIAGNOSIS — Z79891 Long term (current) use of opiate analgesic: Secondary | ICD-10-CM | POA: Diagnosis not present

## 2022-06-23 HISTORY — PX: NEPHROLITHOTOMY: SHX5134

## 2022-06-23 LAB — CBC
HCT: 35.4 % — ABNORMAL LOW (ref 36.0–46.0)
Hemoglobin: 11.7 g/dL — ABNORMAL LOW (ref 12.0–15.0)
MCH: 29.3 pg (ref 26.0–34.0)
MCHC: 33.1 g/dL (ref 30.0–36.0)
MCV: 88.7 fL (ref 80.0–100.0)
Platelets: 328 10*3/uL (ref 150–400)
RBC: 3.99 MIL/uL (ref 3.87–5.11)
RDW: 14 % (ref 11.5–15.5)
WBC: 20.4 10*3/uL — ABNORMAL HIGH (ref 4.0–10.5)
nRBC: 0 % (ref 0.0–0.2)

## 2022-06-23 LAB — GLUCOSE, CAPILLARY: Glucose-Capillary: 174 mg/dL — ABNORMAL HIGH (ref 70–99)

## 2022-06-23 SURGERY — NEPHROLITHOTOMY PERCUTANEOUS
Anesthesia: General | Site: Back | Laterality: Right

## 2022-06-23 MED ORDER — FENTANYL CITRATE (PF) 100 MCG/2ML IJ SOLN
INTRAMUSCULAR | Status: AC
  Filled 2022-06-23: qty 2

## 2022-06-23 MED ORDER — ZOLPIDEM TARTRATE 5 MG PO TABS: 5.0000 mg | ORAL_TABLET | Freq: Every evening | ORAL | Status: DC | PRN

## 2022-06-23 MED ORDER — FENTANYL CITRATE (PF) 100 MCG/2ML IJ SOLN
INTRAMUSCULAR | Status: DC | PRN
  Administered 2022-06-23: 100 ug via INTRAVENOUS
  Administered 2022-06-23: 50 ug via INTRAVENOUS
  Administered 2022-06-23: 100 ug via INTRAVENOUS
  Administered 2022-06-23: 50 ug via INTRAVENOUS

## 2022-06-23 MED ORDER — BUPIVACAINE-EPINEPHRINE 0.5% -1:200000 IJ SOLN
INTRAMUSCULAR | Status: DC | PRN
  Administered 2022-06-23: 30 mL

## 2022-06-23 MED ORDER — DEXAMETHASONE SODIUM PHOSPHATE 10 MG/ML IJ SOLN
INTRAMUSCULAR | Status: AC
  Filled 2022-06-23: qty 1

## 2022-06-23 MED ORDER — PROPOFOL 10 MG/ML IV BOLUS
INTRAVENOUS | Status: AC
  Filled 2022-06-23: qty 20

## 2022-06-23 MED ORDER — HYDRALAZINE HCL 20 MG/ML IJ SOLN: 5.0000 mg | INTRAMUSCULAR | Status: DC | PRN

## 2022-06-23 MED ORDER — BUPIVACAINE-EPINEPHRINE (PF) 0.5% -1:200000 IJ SOLN
INTRAMUSCULAR | Status: AC
  Filled 2022-06-23: qty 30

## 2022-06-23 MED ORDER — CIPROFLOXACIN IN D5W 400 MG/200ML IV SOLN
400.0000 mg | INTRAVENOUS | Status: AC
  Administered 2022-06-23: 400 mg via INTRAVENOUS
  Filled 2022-06-23: qty 200

## 2022-06-23 MED ORDER — PHENYLEPHRINE 80 MCG/ML (10ML) SYRINGE FOR IV PUSH (FOR BLOOD PRESSURE SUPPORT)
PREFILLED_SYRINGE | INTRAVENOUS | Status: AC
  Filled 2022-06-23: qty 10

## 2022-06-23 MED ORDER — CIPROFLOXACIN HCL 500 MG PO TABS
500.0000 mg | ORAL_TABLET | Freq: Two times a day (BID) | ORAL | Status: DC
  Administered 2022-06-23 – 2022-06-24 (×2): 500 mg via ORAL
  Filled 2022-06-23 (×2): qty 1

## 2022-06-23 MED ORDER — HYDROCHLOROTHIAZIDE 25 MG PO TABS
25.0000 mg | ORAL_TABLET | Freq: Every day | ORAL | Status: DC
  Administered 2022-06-23 – 2022-06-24 (×2): 25 mg via ORAL
  Filled 2022-06-23 (×2): qty 1

## 2022-06-23 MED ORDER — IOHEXOL 300 MG/ML  SOLN
INTRAMUSCULAR | Status: DC | PRN
  Administered 2022-06-23: 30 mL

## 2022-06-23 MED ORDER — ONDANSETRON HCL 4 MG/2ML IJ SOLN
INTRAMUSCULAR | Status: AC
  Filled 2022-06-23: qty 2

## 2022-06-23 MED ORDER — ACETAMINOPHEN 10 MG/ML IV SOLN
1000.0000 mg | Freq: Four times a day (QID) | INTRAVENOUS | Status: AC
  Administered 2022-06-23 – 2022-06-24 (×4): 1000 mg via INTRAVENOUS
  Filled 2022-06-23 (×3): qty 100

## 2022-06-23 MED ORDER — ROCURONIUM BROMIDE 10 MG/ML (PF) SYRINGE
PREFILLED_SYRINGE | INTRAVENOUS | Status: DC | PRN
  Administered 2022-06-23: 60 mg via INTRAVENOUS

## 2022-06-23 MED ORDER — VITAMIN D (ERGOCALCIFEROL) 1.25 MG (50000 UNIT) PO CAPS: 50000.0000 [IU] | ORAL_CAPSULE | ORAL | Status: DC

## 2022-06-23 MED ORDER — LIDOCAINE HCL (PF) 2 % IJ SOLN
INTRAMUSCULAR | Status: AC
  Filled 2022-06-23: qty 5

## 2022-06-23 MED ORDER — SUGAMMADEX SODIUM 200 MG/2ML IV SOLN
INTRAVENOUS | Status: DC | PRN
  Administered 2022-06-23: 200 mg via INTRAVENOUS

## 2022-06-23 MED ORDER — DEXAMETHASONE SODIUM PHOSPHATE 10 MG/ML IJ SOLN
INTRAMUSCULAR | Status: DC | PRN
  Administered 2022-06-23: 10 mg via INTRAVENOUS

## 2022-06-23 MED ORDER — FUROSEMIDE 10 MG/ML IJ SOLN
INTRAMUSCULAR | Status: AC
  Filled 2022-06-23: qty 2

## 2022-06-23 MED ORDER — DIAZEPAM 5 MG PO TABS
5.0000 mg | ORAL_TABLET | Freq: Once | ORAL | Status: AC
  Administered 2022-06-23: 5 mg via ORAL
  Filled 2022-06-23: qty 1

## 2022-06-23 MED ORDER — ACETAMINOPHEN 500 MG PO TABS
1000.0000 mg | ORAL_TABLET | Freq: Once | ORAL | Status: AC
  Administered 2022-06-23: 1000 mg via ORAL
  Filled 2022-06-23: qty 2

## 2022-06-23 MED ORDER — SCOPOLAMINE 1 MG/3DAYS TD PT72
1.0000 | MEDICATED_PATCH | Freq: Once | TRANSDERMAL | Status: DC
  Administered 2022-06-23: 1.5 mg via TRANSDERMAL
  Filled 2022-06-23: qty 1

## 2022-06-23 MED ORDER — FENTANYL CITRATE PF 50 MCG/ML IJ SOSY: 25.0000 ug | PREFILLED_SYRINGE | INTRAMUSCULAR | Status: DC | PRN

## 2022-06-23 MED ORDER — ONDANSETRON HCL 4 MG/2ML IJ SOLN
4.0000 mg | Freq: Four times a day (QID) | INTRAMUSCULAR | Status: DC | PRN
  Administered 2022-06-23 – 2022-06-24 (×2): 4 mg via INTRAVENOUS
  Filled 2022-06-23 (×3): qty 2

## 2022-06-23 MED ORDER — VENLAFAXINE HCL ER 150 MG PO CP24
150.0000 mg | ORAL_CAPSULE | Freq: Every day | ORAL | Status: DC
  Administered 2022-06-24: 150 mg via ORAL
  Filled 2022-06-23: qty 1

## 2022-06-23 MED ORDER — TRAMADOL HCL 50 MG PO TABS
50.0000 mg | ORAL_TABLET | Freq: Four times a day (QID) | ORAL | Status: DC | PRN
  Administered 2022-06-23: 100 mg via ORAL
  Administered 2022-06-24 (×2): 50 mg via ORAL
  Filled 2022-06-23 (×2): qty 1

## 2022-06-23 MED ORDER — SODIUM CHLORIDE 0.9 % IR SOLN
Status: DC | PRN
  Administered 2022-06-23: 12000 mL

## 2022-06-23 MED ORDER — ORAL CARE MOUTH RINSE: 15.0000 mL | OROMUCOSAL | Status: DC | PRN

## 2022-06-23 MED ORDER — PROGESTERONE MICRONIZED 100 MG PO CAPS: 100.0000 mg | ORAL_CAPSULE | Freq: Every day | ORAL | Status: DC

## 2022-06-23 MED ORDER — LACTATED RINGERS IV SOLN: INTRAVENOUS | Status: DC

## 2022-06-23 MED ORDER — PROPOFOL 10 MG/ML IV BOLUS
INTRAVENOUS | Status: DC | PRN
  Administered 2022-06-23: 130 mg via INTRAVENOUS
  Administered 2022-06-23: 50 mg via INTRAVENOUS
  Administered 2022-06-23: 20 mg via INTRAVENOUS

## 2022-06-23 MED ORDER — PHENYLEPHRINE 80 MCG/ML (10ML) SYRINGE FOR IV PUSH (FOR BLOOD PRESSURE SUPPORT)
PREFILLED_SYRINGE | INTRAVENOUS | Status: DC | PRN
  Administered 2022-06-23: 80 ug via INTRAVENOUS

## 2022-06-23 MED ORDER — PANTOPRAZOLE SODIUM 40 MG PO TBEC
40.0000 mg | DELAYED_RELEASE_TABLET | Freq: Every day | ORAL | Status: DC
  Administered 2022-06-23: 40 mg via ORAL
  Filled 2022-06-23: qty 1

## 2022-06-23 MED ORDER — VALACYCLOVIR HCL 500 MG PO TABS
1000.0000 mg | ORAL_TABLET | Freq: Every day | ORAL | Status: DC
  Administered 2022-06-23 – 2022-06-24 (×2): 1000 mg via ORAL
  Filled 2022-06-23 (×2): qty 2

## 2022-06-23 MED ORDER — HYDROMORPHONE HCL 1 MG/ML IJ SOLN: 0.5000 mg | INTRAMUSCULAR | Status: DC | PRN

## 2022-06-23 MED ORDER — FENTANYL CITRATE PF 50 MCG/ML IJ SOSY
PREFILLED_SYRINGE | INTRAMUSCULAR | Status: AC
  Administered 2022-06-23: 50 ug via INTRAVENOUS
  Filled 2022-06-23: qty 1

## 2022-06-23 MED ORDER — MIDAZOLAM HCL 2 MG/2ML IJ SOLN
INTRAMUSCULAR | Status: AC
  Filled 2022-06-23: qty 2

## 2022-06-23 MED ORDER — TRAMADOL HCL 50 MG PO TABS: 50.0000 mg | ORAL_TABLET | Freq: Four times a day (QID) | ORAL | 0 refills | Status: AC | PRN

## 2022-06-23 MED ORDER — ONDANSETRON HCL 4 MG/2ML IJ SOLN
INTRAMUSCULAR | Status: DC | PRN
  Administered 2022-06-23: 4 mg via INTRAVENOUS

## 2022-06-23 MED ORDER — PROMETHAZINE HCL 25 MG/ML IJ SOLN: 6.2500 mg | INTRAMUSCULAR | Status: DC | PRN

## 2022-06-23 MED ORDER — TRAMADOL HCL 50 MG PO TABS
ORAL_TABLET | ORAL | Status: AC
  Filled 2022-06-23: qty 2

## 2022-06-23 MED ORDER — LIDOCAINE 2% (20 MG/ML) 5 ML SYRINGE
INTRAMUSCULAR | Status: DC | PRN
  Administered 2022-06-23: 20 mg via INTRAVENOUS
  Administered 2022-06-23: 60 mg via INTRAVENOUS
  Administered 2022-06-23: 10 mg via INTRAVENOUS

## 2022-06-23 MED ORDER — FUROSEMIDE 10 MG/ML IJ SOLN
20.0000 mg | Freq: Once | INTRAMUSCULAR | Status: AC
  Administered 2022-06-23: 20 mg via INTRAVENOUS

## 2022-06-23 MED ORDER — ROCURONIUM BROMIDE 10 MG/ML (PF) SYRINGE
PREFILLED_SYRINGE | INTRAVENOUS | Status: AC
  Filled 2022-06-23: qty 10

## 2022-06-23 MED ORDER — CHLORHEXIDINE GLUCONATE 0.12 % MT SOLN
15.0000 mL | Freq: Once | OROMUCOSAL | Status: AC
  Administered 2022-06-23: 15 mL via OROMUCOSAL

## 2022-06-23 MED ORDER — MIDAZOLAM HCL 5 MG/5ML IJ SOLN
INTRAMUSCULAR | Status: DC | PRN
  Administered 2022-06-23: 2 mg via INTRAVENOUS

## 2022-06-23 MED ORDER — ACETAMINOPHEN 10 MG/ML IV SOLN
INTRAVENOUS | Status: AC
  Filled 2022-06-23: qty 100

## 2022-06-23 SURGICAL SUPPLY — 69 items
APL PRP STRL LF DISP 70% ISPRP (MISCELLANEOUS) ×1
APL SKNCLS STERI-STRIP NONHPOA (GAUZE/BANDAGES/DRESSINGS) ×1
BAG COUNTER SPONGE SURGICOUNT (BAG) IMPLANT
BAG DRN RND TRDRP ANRFLXCHMBR (UROLOGICAL SUPPLIES) ×1
BAG SPNG CNTER NS LX DISP (BAG)
BAG URINE DRAIN 2000ML AR STRL (UROLOGICAL SUPPLIES) IMPLANT
BASKET STONE NCOMPASS (UROLOGICAL SUPPLIES) IMPLANT
BASKET ZERO TIP NITINOL 2.4FR (BASKET) IMPLANT
BENZOIN TINCTURE PRP APPL 2/3 (GAUZE/BANDAGES/DRESSINGS) ×2 IMPLANT
BLADE SURG 15 STRL LF DISP TIS (BLADE) ×2 IMPLANT
BLADE SURG 15 STRL SS (BLADE) ×1
BOOTIES KNEE HIGH SLOAN (MISCELLANEOUS) IMPLANT
BSKT STON RTRVL ZERO TP 2.4FR (BASKET) ×1
CATH 2WAY 30CC 24FR (CATHETERS) IMPLANT
CATH URETERAL DUAL LUMEN 10F (MISCELLANEOUS) ×2 IMPLANT
CATH URETL OPEN 5X70 (CATHETERS) ×2 IMPLANT
CATH UROLOGY TORQUE 65 (CATHETERS) IMPLANT
CATH X-FORCE N30 NEPHROSTOMY (TUBING) ×2 IMPLANT
CHLORAPREP W/TINT 26 (MISCELLANEOUS) ×2 IMPLANT
COVER BACK TABLE 60X90IN (DRAPES) ×2 IMPLANT
DRAPE C-ARM 42X120 X-RAY (DRAPES) ×2 IMPLANT
DRAPE LINGEMAN PERC (DRAPES) ×2 IMPLANT
DRAPE SURG IRRIG POUCH 19X23 (DRAPES) ×4 IMPLANT
DRSG TEGADERM 6X8 (GAUZE/BANDAGES/DRESSINGS) IMPLANT
DRSG TEGADERM 8X12 (GAUZE/BANDAGES/DRESSINGS) ×4 IMPLANT
EXTRACTOR STONE 1.7FRX115CM (UROLOGICAL SUPPLIES) IMPLANT
GAUZE PAD ABD 8X10 STRL (GAUZE/BANDAGES/DRESSINGS) IMPLANT
GAUZE SPONGE 4X4 12PLY STRL (GAUZE/BANDAGES/DRESSINGS) IMPLANT
GLOVE SURG LX STRL 7.5 STRW (GLOVE) ×2 IMPLANT
GOWN STRL REUS W/ TWL XL LVL3 (GOWN DISPOSABLE) ×2 IMPLANT
GOWN STRL REUS W/TWL XL LVL3 (GOWN DISPOSABLE) ×1
GUIDEWIRE AMPLAZ .035X145 (WIRE) ×4 IMPLANT
GUIDEWIRE ANG ZIPWIRE 038X150 (WIRE) IMPLANT
GUIDEWIRE STR DUAL SENSOR (WIRE) ×2 IMPLANT
HLDR NDL AMPLATZ W/INSERTS (MISCELLANEOUS) IMPLANT
HOLDER NEEDLE AMPLATZ W/INSERT (MISCELLANEOUS) ×1 IMPLANT
IV SET EXTENSION CATH 6 NF (IV SETS) ×2 IMPLANT
KIT BASIN OR (CUSTOM PROCEDURE TRAY) ×2 IMPLANT
KIT PROBE 340X3.4XDISP GRN (MISCELLANEOUS) IMPLANT
KIT PROBE TRILOGY 3.4X340 (MISCELLANEOUS)
KIT PROBE TRILOGY 3.9X350 (MISCELLANEOUS) IMPLANT
LASER FIB FLEXIVA PULSE ID 365 (Laser) IMPLANT
LEGGING LITHOTOMY PAIR STRL (DRAPES) ×2 IMPLANT
LUBRICANT JELLY K Y 4OZ (MISCELLANEOUS) ×2 IMPLANT
MANIFOLD NEPTUNE II (INSTRUMENTS) ×2 IMPLANT
NDL SPNL 20GX3.5 QUINCKE YW (NEEDLE) IMPLANT
NDL TROCAR 18X15 ECHO (NEEDLE) IMPLANT
NDL TROCAR 18X20 (NEEDLE) IMPLANT
NEEDLE SPNL 20GX3.5 QUINCKE YW (NEEDLE) ×1 IMPLANT
NEEDLE TROCAR 18X15 ECHO (NEEDLE) ×1 IMPLANT
NEEDLE TROCAR 18X20 (NEEDLE) IMPLANT
NS IRRIG 1000ML POUR BTL (IV SOLUTION) ×2 IMPLANT
PACK CYSTO (CUSTOM PROCEDURE TRAY) IMPLANT
SHEATH PEELAWAY SET 9 (SHEATH) IMPLANT
SPONGE T-LAP 4X18 ~~LOC~~+RFID (SPONGE) ×2 IMPLANT
STENT URET 6FRX24 CONTOUR (STENTS) IMPLANT
SUT ETHILON 3 0 PS 1 (SUTURE) IMPLANT
SUT SILK 0 (SUTURE) ×1
SUT SILK 0 30XBRD TIE 6 (SUTURE) ×2 IMPLANT
SYR 10ML LL (SYRINGE) ×2 IMPLANT
SYR 20ML LL LF (SYRINGE) ×4 IMPLANT
SYR 50ML LL SCALE MARK (SYRINGE) ×2 IMPLANT
TOWEL OR 17X26 10 PK STRL BLUE (TOWEL DISPOSABLE) ×2 IMPLANT
TRACTIP FLEXIVA PULS ID 200XHI (Laser) IMPLANT
TRACTIP FLEXIVA PULSE ID 200 (Laser)
TRAY FOLEY MTR SLVR 16FR STAT (SET/KITS/TRAYS/PACK) ×2 IMPLANT
TUBING CONNECTING 10 (TUBING) ×4 IMPLANT
TUBING STONE CATCHER TRILOGY (MISCELLANEOUS) IMPLANT
TUBING UROLOGY SET (TUBING) ×2 IMPLANT

## 2022-06-23 NOTE — H&P (Signed)
Pt presents today for pre-operative history and physical exam in anticipation of right PCNL with surgeon access and right ureteral stent placement by Dr. Louis Meckel on 06/23/22. She is doing well and is without complaint.   Pt denies F/C, HA, CP, SOB, N/V, diarrhea/constipation, back pain, flank pain, hematuria, and dysuria.    HX:    56 year old female presents today for discussion of right partial staghorn stone. She has a 2.5 cm stone in the right lower pole right kidney. This was found as part of a work-up for recurrent urinary tract infections. The patient is otherwise asymptomatic. She has had 3 infections over the past 6 months. Her last infection was in June. Currently she is not taking into any antibiotics or treatment. She does take a probiotic. She has never had a kidney stone before.   Patient has past medical history of hypertension and hypercholesterolemia. She takes hormone replacement therapy. She has had carpal tunnel syndrome and a C-section, but otherwise no major abdominal surgeries.     ALLERGIES: Amoxicillin - Hives Nitrofurantoin - Itching    MEDICATIONS: Hydrochlorothiazide 12.5 mg tablet  Atorvastatin Calcium 10 mg tablet  Esomeprazole Magnesium 40 mg capsule,delayed release  Estradiol 2 mg tablet  Pristiq 50 mg tablet, extended release 24 hr  Valacyclovir 1,000 mg tablet  Vitamin D3     GU PSH: Catheterize For Residual - 01/31/2022 D&C Non-OB     NON-GU PSH: Carpal tunnel surgery, Bilateral Cesarean Delivery     GU PMH: Renal calculus - 04/15/2022, - 02/25/2022, - 02/14/2022, - 01/31/2022 Personal Hx Urinary Tract Infections - 02/25/2022, - 01/31/2022 Urinary Calculus, Unspec    NON-GU PMH: Pyuria/other UA findings - 01/31/2022 Depression Diabetes Type 2 GERD Hepatitis B Hypercholesterolemia Hypertension    FAMILY HISTORY: Hypertension - Father   SOCIAL HISTORY: Marital Status: Divorced Ethnicity: Not Hispanic Or Latino; Race: Black or African  American Current Smoking Status: Patient has never smoked.   Tobacco Use Assessment Completed: Used Tobacco in last 30 days? Does not use smokeless tobacco. Does drink.  Does not use drugs. Drinks 1 caffeinated drink per day. Has not had a blood transfusion.     Notes: ETOH 1 x per week wine    REVIEW OF SYSTEMS:    GU Review Female:   Patient denies frequent urination, hard to postpone urination, burning /pain with urination, get up at night to urinate, leakage of urine, stream starts and stops, trouble starting your stream, have to strain to urinate, and being pregnant.  Gastrointestinal (Upper):   Patient denies nausea, vomiting, and indigestion/ heartburn.  Gastrointestinal (Lower):   Patient denies diarrhea and constipation.  Constitutional:   Patient denies fever, night sweats, weight loss, and fatigue.  Skin:   Patient denies itching and skin rash/ lesion.  Eyes:   Patient denies blurred vision and double vision.  Ears/ Nose/ Throat:   Patient denies sore throat and sinus problems.  Hematologic/Lymphatic:   Patient denies swollen glands and easy bruising.  Cardiovascular:   Patient denies leg swelling and chest pains.  Respiratory:   Patient denies cough and shortness of breath.  Endocrine:   Patient denies excessive thirst.  Musculoskeletal:   Patient denies back pain and joint pain.  Neurological:   Patient denies headaches and dizziness.  Psychologic:   Patient denies depression and anxiety.   VITAL SIGNS:      06/07/2022 03:35 PM  Weight 170 lb / 77.11 kg  Height 63 in / 160.02 cm  BP 132/84 mmHg  Pulse  78 /min  Temperature 97.1 F / 36.1 C  BMI 30.1 kg/m   MULTI-SYSTEM PHYSICAL EXAMINATION:    Constitutional: Well-nourished. No physical deformities. Normally developed. Good grooming.  Neck: Neck symmetrical, not swollen. Normal tracheal position.  Respiratory: Normal breath sounds. No labored breathing, no use of accessory muscles.   Cardiovascular: Regular rate  and rhythm. No murmur, no gallop.   Lymphatic: No enlargement of neck, axillae, groin.  Skin: No paleness, no jaundice, no cyanosis. No lesion, no ulcer, no rash.  Neurologic / Psychiatric: Oriented to time, oriented to place, oriented to person. No depression, no anxiety, no agitation.  Gastrointestinal: No mass, no tenderness, no rigidity, non obese abdomen.  Eyes: Normal conjunctivae. Normal eyelids.  Ears, Nose, Mouth, and Throat: Left ear no scars, no lesions, no masses. Right ear no scars, no lesions, no masses. Nose no scars, no lesions, no masses. Normal hearing. Normal lips.  Musculoskeletal: Normal gait and station of head and neck.     Complexity of Data:  Records Review:   Previous Patient Records  Urine Test Review:   Urinalysis   06/07/22  Urinalysis  Urine Appearance Slightly Cloudy   Urine Color Yellow   Urine Glucose Neg mg/dL  Urine Bilirubin Neg mg/dL  Urine Ketones Neg mg/dL  Urine Specific Gravity 1.025   Urine Blood 1+ ery/uL  Urine pH 6.0   Urine Protein Neg mg/dL  Urine Urobilinogen 0.2 mg/dL  Urine Nitrites Neg   Urine Leukocyte Esterase 1+ leu/uL  Urine WBC/hpf 6 - 10/hpf   Urine RBC/hpf 3 - 10/hpf   Urine Epithelial Cells 0 - 5/hpf   Urine Bacteria Few (10-25/hpf)   Urine Mucous Present   Urine Yeast NS (Not Seen)   Urine Trichomonas Not Present   Urine Cystals NS (Not Seen)   Urine Casts NS (Not Seen)   Urine Sperm Not Present    PROCEDURES:          Urinalysis w/Scope - 81001 Dipstick Dipstick Cont'd Micro  Color: Yellow Bilirubin: Neg mg/dL WBC/hpf: 6 - 10/hpf  Appearance: Slightly Cloudy Ketones: Neg mg/dL RBC/hpf: 3 - 10/hpf  Specific Gravity: 1.025 Blood: 1+ ery/uL Bacteria: Few (10-25/hpf)  pH: 6.0 Protein: Neg mg/dL Cystals: NS (Not Seen)  Glucose: Neg mg/dL Urobilinogen: 0.2 mg/dL Casts: NS (Not Seen)    Nitrites: Neg Trichomonas: Not Present    Leukocyte Esterase: 1+ leu/uL Mucous: Present      Epithelial Cells: 0 - 5/hpf       Yeast: NS (Not Seen)      Sperm: Not Present    ASSESSMENT:      ICD-10 Details  1 GU:   Renal calculus - N20.0    PLAN:            Medications Stop Meds: Progesterone 100 mg capsule  Discontinue: 06/07/2022  - Reason: The medication cycle was completed.            Orders Labs Urine Culture          Schedule Return Visit/Planned Activity: Keep Scheduled Appointment - Schedule Surgery          Document Letter(s):  Created for Patient: Clinical Summary         Notes:   There are no changes in the patients history or physical exam since last evaluation by Dr. Louis Meckel. Pt is scheduled to undergo right PCNL with surgeon access and right ureteral stent placement on 06/23/22.   Urine for culture due to bacteria/WBCs on UA.

## 2022-06-23 NOTE — Transfer of Care (Signed)
Immediate Anesthesia Transfer of Care Note  Patient: Catherine Odonnell  Procedure(s) Performed: RIGHT NEPHROLITHOTOMY PERCUTANEOUS WITH SURGEON ACCESS/RIGHT URETERAL STENT PLACEMENT (Right: Back)  Patient Location: PACU  Anesthesia Type:General  Level of Consciousness: sedated  Airway & Oxygen Therapy: Patient Spontanous Breathing and Patient connected to face mask oxygen  Post-op Assessment: Report given to RN and Post -op Vital signs reviewed and stable  Post vital signs: Reviewed and stable  Last Vitals:  Vitals Value Taken Time  BP    Temp    Pulse 81 06/23/22 1050  Resp    SpO2 99 % 06/23/22 1050  Vitals shown include unvalidated device data.  Last Pain:  Vitals:   06/23/22 0634  TempSrc:   PainSc: 0-No pain         Complications:  Encounter Notable Events  Notable Event Outcome Phase Comment  Difficult to intubate - unexpected  Intraprocedure Filed from anesthesia note documentation.

## 2022-06-23 NOTE — Interval H&P Note (Signed)
History and Physical Interval Note:  06/23/2022 7:25 AM  Catherine Odonnell  has presented today for surgery, with the diagnosis of RIGHT KIDNEY STONE >2CM.  The various methods of treatment have been discussed with the patient and family. After consideration of risks, benefits and other options for treatment, the patient has consented to  Procedure(s) with comments: RIGHT NEPHROLITHOTOMY PERCUTANEOUS WITH SURGEON ACCESS/RIGHT URETERAL STENT PLACEMENT (Right) - 210 MINUTES NEEDED as a surgical intervention.  The patient's history has been reviewed, patient examined, no change in status, stable for surgery.  I have reviewed the patient's chart and labs.  Questions were answered to the patient's satisfaction.     Ardis Hughs

## 2022-06-23 NOTE — Discharge Instructions (Signed)
Discharge instructions following PCNL  Call your doctor for: Fevers greater than 100.5 Severe nausea or vomiting Increasing pain not controlled by pain medication Increasing redness or drainage from incisions Decreased urine output or a catheter is no longer draining  The number for questions is [phone].  Activity: Gradually increase activity with short frequent walks, 3-4 times a day.  Avoid strenuous activities, like sports, lawn-mowing, or heavy lifting (more than 10-15 pounds).  Wear loose, comfortable clothing that pull or kink the tube or tubes.  Do not drive while taking pain medication, or until your doctor permitts it.  Bathing and dressing changes: You should not shower for 48 hours after surgery.  Do not soak your back in a bathtub.  Diet: It is extremely important to drink plenty of fluids after surgery, especially water.  You may resume your regular diet, unless otherwise instructed.  Medications: May take Tylenol (acetaminophen) or ibuprofen (Advil, Motrin) as directed over-the-counter. Take any prescriptions as directed.  Follow-up appointments: Follow-up appointment will be scheduled with Dr. Francetta Ilg in 10-14 days for hospital check and stent removal.  

## 2022-06-23 NOTE — Anesthesia Procedure Notes (Signed)
Procedure Name: Intubation Date/Time: 06/23/2022 7:39 AM  Performed by: Lind Covert, CRNAPre-anesthesia Checklist: Patient identified, Emergency Drugs available, Suction available, Patient being monitored and Timeout performed Patient Re-evaluated:Patient Re-evaluated prior to induction Oxygen Delivery Method: Circle system utilized Preoxygenation: Pre-oxygenation with 100% oxygen Induction Type: IV induction Ventilation: Mask ventilation without difficulty Laryngoscope Size: Mac, 3 and Glidescope Grade View: Grade III Tube type: Oral Number of attempts: 2 Airway Equipment and Method: Stylet Placement Confirmation: ETT inserted through vocal cords under direct vision, positive ETCO2 and breath sounds checked- equal and bilateral Secured at: 22 cm Dental Injury: Teeth and Oropharynx as per pre-operative assessment  Difficulty Due To: Difficulty was unanticipated and Difficult Airway- due to anterior larynx Future Recommendations: Recommend- induction with short-acting agent, and alternative techniques readily available Comments: First attempt with MAC 4 blade, anterior larynx unable to see cords grade 3 view.Glidescope MAC 3 used with clear visualization ETT guided through cords with grade 1 view.

## 2022-06-23 NOTE — Anesthesia Postprocedure Evaluation (Signed)
Anesthesia Post Note  Patient: Catherine Odonnell  Procedure(s) Performed: RIGHT NEPHROLITHOTOMY PERCUTANEOUS WITH SURGEON ACCESS/RIGHT URETERAL STENT PLACEMENT (Right: Back)     Patient location during evaluation: PACU Anesthesia Type: General Level of consciousness: awake and alert Pain management: pain level controlled Vital Signs Assessment: post-procedure vital signs reviewed and stable Respiratory status: spontaneous breathing, nonlabored ventilation, respiratory function stable and patient connected to nasal cannula oxygen Cardiovascular status: blood pressure returned to baseline and stable Postop Assessment: no apparent nausea or vomiting Anesthetic complications: yes   Encounter Notable Events  Notable Event Outcome Phase Comment  Difficult to intubate - unexpected  Intraprocedure Filed from anesthesia note documentation.    Last Vitals:  Vitals:   06/23/22 1315 06/23/22 1345  BP: 130/78 137/87  Pulse: 71 80  Resp: (!) 21 17  Temp:    SpO2: 95% 96%    Last Pain:  Vitals:   06/23/22 1315  TempSrc:   PainSc: [place]

## 2022-06-23 NOTE — Op Note (Signed)
Pre-operative diagnosis: right sided nephrolithiasis, > 2.0 centimeters  Post-operative diagnosis: as above    Procedure performed:  cystoscopy, right retrograde pyelogram with interpretation, right percutaneous renal access, right ureteroscopy, right nephrolithotomy, right nephrostogram,  right ureteral stent exchange, right nephrostomy tube placement.   Surgeon: Dr. Ardis Hughs  Assistant: none   Anesthesia: General   Complications: None   Specimens: Several small fragments of the stone extracted will be sent to [facility] for stone composition analysis   Findings: #1 - normal retrograde pyelogram with filling defect in the lower pole consistent with the patient's stone.   #2 - difficult access, getting passed the stone, required ureteroscopic guidance to visualize needle passed stone #3 - all stones removed via lithoclast device #4 - 24cm x 6 F stent placed antegrade #5 - no neph tube left at the end   EBL: Approximately 150 cc   Specimens: stone from collecting system - taken to [facility] lab   Indication: Indication: Catherine Odonnell is a 56 y.o.  patient with  a large burden right nephrolithiasis. The patient presented today for follow-up definitive management. After  reviewing the management options for treatment, elected to proceed with the above surgical procedure(s). We have discussed the potential benefits and risks of the procedure, side effects of the proposed treatment, the likelihood of the patient achieving the goals of the procedure, and any potential problems that might occur during the procedure or recuperation. Informed consent has been obtained.     Description:  Consent was obtained in the preoperative holding area. The patient was marked appropriately and then taken back to the operating room where they were intubated on the gurney. The patient was flipped prone onto the OR table. Large jelly rolls were placed in the anterior axillary  line on both sides allowing the patient's chest and abdomen to fall inbetween. The patient was then prepped and draped in the routine sterile fashion in the right flank. A timeout was then held confirming the proper side and procedure as well as antibiotics were administered.  I then used the flexible cystoscope and passed gently into the patient's urethra under visual guidance. I then passed a wire through the stent and up into the right collecting system. I then removed the stent over the wire and exchanged for a 5 [place] open-ended ureteral Pollock catheter. The Pollack catheter was then advanced up to the UPJ. The wire was then removed and a retrograde pyelogram was performed with the above findings. I then turned my attention to the patient's right flank and obtaining percutaneous renal access.  Using the C-arm rotated at 30 and the bulls-eye technique with an 18-gauge coaxial needle the lower posterior lateral calyx was targeted. Then rotating the C-arm AP depth of our needle was noted to be within the calyx and the inner part of the coaxial needle was removed. Urine was noted to return. A 0.038 sensor wire was then passed through the sheath of the coaxial needle and into the right renal collecting system.  After long time to try to get the wire past the stone I did at this point to use the ureteroscope from below to guide the wire past the stone.  I did this by removing the open-ended catheter and exchanged for a wire and then passing dual-lumen catheter up to advance a second wire up into the collecting system from below.  I then remove the dual-lumen catheter and advanced a flexible ureteroscope over the second wire and was able  to get this into the lower pole.  I then was able to use this to guide the wire passed the stone.  Once we got to this point I removed the ureteroscope and replaced the open-ended catheter.  The wire was then passed down the ureter and into the bladder using fluoroscopic guide  and the sheath of the needle was removed. An angiographic catheter was then advanced into the bladder and the wire removed. A Super Stiff wire was then passed into the angiographic catheter and the angiographic catheter removed.  I then used the dual-lumen catheter and advanced this down into the bladder and advanced a second Super Stiff wire over the dual-lumen catheter removing the catheter afterwards,  establishing 2 superstiff wires through the targeted calyx and into the bladder.   The 40 French NephroMax balloon was then passed over one of the Super Stiff wires and the tip guided down into the targeted calyx. The balloon was then inflated to approximately 12 atm, and once there was no waist noted under fluoroscopy the access sheath was advanced over the balloon. The balloon was then removed. The wires were then placed back into the sheaths and snapped to the drape.   Using the rigid nephroscope to explore the targeted calyx and kidney.  The stones were removed using the LithoClast device.  Then using a flexible cystoscope to navigate the remaining calyces of the kidney no additional stones were noted, I did remove some small blood clots.  The sensor wire was then passed to the bladder using the flexible cystoscope.  The wire was then backloaded over the rigid nephroscope using the stent pusher and a 24 cm x 6 French double-J ureteral stent was passed antegrade over the sensor wire down into the bladder under fluoroscopic guidance. Once the stent was in the bladder the wire was gently pulled back and a nice curl noted in the bladder. The wire completely removed from the stent, and nice curl on the proximal end of the stent was noted in the renal pelvis. The sheath was then backed out slowly to ensure that all calyces had been inspected and there was nothing behind the sheath.   A 81F ainsworth tip catheter was then passed over one of the Super Stiff wires through the sheath and into the renal pelvis. The  sheath was then backed out of the kidney and cut over the red rubber catheter. A nephrostogram was then performed confirming the position of our nephrostomy tube and reassuring that there were no longer any filling defects from the patient's symptoms. As such, I remove the nephrostomy tube as well as the safety wire. 25 cc of local anesthesia was then injected into the patient's wound, and the wound was closed with 3-0 nylon in 2 vertical mattress sutures. The incision was then padded using a bundle of 4 x 4's and Hypafix tape. Patient was subsequently rolled over to the supine position and extubated. The patient was returned to the PACU in excellent condition. At the end of the case all lap and needle and sponges were accounted for. There are no perioperative complications.

## 2022-06-24 ENCOUNTER — Encounter (HOSPITAL_COMMUNITY): Payer: Self-pay | Admitting: Urology

## 2022-06-24 DIAGNOSIS — N200 Calculus of kidney: Secondary | ICD-10-CM | POA: Diagnosis not present

## 2022-06-24 LAB — BASIC METABOLIC PANEL
Anion gap: 10 (ref 5–15)
BUN: 9 mg/dL (ref 6–20)
CO2: 26 mmol/L (ref 22–32)
Calcium: 8 mg/dL — ABNORMAL LOW (ref 8.9–10.3)
Chloride: 98 mmol/L (ref 98–111)
Creatinine, Ser: 0.69 mg/dL (ref 0.44–1.00)
GFR, Estimated: >60 mL/min (ref >60)
Glucose, Bld: 111 mg/dL — ABNORMAL HIGH (ref 70–99)
Potassium: 3 mmol/L — ABNORMAL LOW (ref 3.5–5.1)
Sodium: 134 mmol/L — ABNORMAL LOW (ref 135–145)

## 2022-06-24 LAB — CBC
HCT: 34.2 % — ABNORMAL LOW (ref 36.0–46.0)
Hemoglobin: 11.3 g/dL — ABNORMAL LOW (ref 12.0–15.0)
MCH: 29 pg (ref 26.0–34.0)
MCHC: 33 g/dL (ref 30.0–36.0)
MCV: 87.7 fL (ref 80.0–100.0)
Platelets: 313 10*3/uL (ref 150–400)
RBC: 3.9 MIL/uL (ref 3.87–5.11)
RDW: 14.1 % (ref 11.5–15.5)
WBC: 21.2 10*3/uL — ABNORMAL HIGH (ref 4.0–10.5)
nRBC: 0 % (ref 0.0–0.2)

## 2022-06-24 MED ORDER — POTASSIUM CHLORIDE CRYS ER 20 MEQ PO TBCR
60.0000 meq | EXTENDED_RELEASE_TABLET | Freq: Once | ORAL | Status: AC
  Administered 2022-06-24: 60 meq via ORAL
  Filled 2022-06-24: qty 3

## 2022-06-24 NOTE — Discharge Summary (Signed)
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 Briefly, Catherine Odonnell is a 56 y.o. year old patient with large right renal stones.   Hospital Course: Patient tolerated the procedure well.  She was then transferred to the floor after an uneventful PACU stay.  Her hospital course was uncomplicated.  On POD#1 she had met discharge criteria: was eating a regular diet, was up and ambulating independently,  pain was well controlled, was voiding without a catheter, and was ready to for discharge.  PE on day of discharge: NAD  Intake/Output Summary (Last 24 hours) at 06/24/2022 0731 Last data filed at 06/24/2022 0445 Gross per 24 hour  Intake 2160 ml  Output 3875 ml  Net -1715 ml    Vitals:   06/23/22 1515 06/23/22 2038 06/24/22 0011 06/24/22 0445  BP: (!) 149/88 139/82 135/85 122/80  Pulse: 74 78 77 84  Resp: _0 Temp: 97.7 F (36.5 C) 98.3 F (36.8 C) 98.5 F (36.9 C) 98.5 F (36.9 C)  TempSrc: Oral Oral Oral Oral  SpO2: 99% 97% 94% 96%  Weight:      Height:       Non-labored breathing Abdomen soft, right flank incision c/d/I Ext symmetric  Laboratory values:  Recent Labs    06/23/22 1546 06/24/22 0511  WBC 20.4* 21.2*  HGB 11.7* 11.3*  HCT 35.4* 34.2*   Recent Labs    06/24/22 0511  NA 134*  K 3.0*  CL 98  CO2 26  GLUCOSE 111*  BUN 9  CREATININE 0.69  CALCIUM 8.0*   No results for input(s): "LABPT", "INR" in the last 72 hours. No results for input(s): "LABURIN" in the last 72 hours. Results for orders placed or  performed during the hospital encounter of 06/07/22  Urine Culture     Status: Abnormal   Collection Time: 06/07/22  2:40 PM   Specimen: Urine, Clean Catch  Result Value Ref Range Status   Specimen Description   Final    URINE, CLEAN CATCH Performed at [facility], [place] [address] [place], [place] [address]    Special Requests   Final    NONE Performed at [facility], [place] [address] [place], [place] [address]    Culture (A)  Final    <10,000 COLONIES/mL INSIGNIFICANT GROWTH Performed at [place] [address] [place], [place] [address]    Report Status 06/08/2022 FINAL  Final    Disposition: Home  Discharge instruction: The patient was instructed to be ambulatory but told to refrain from heavy lifting, strenuous activity, or driving.   Discharge medications:  Allergies as of 06/24/2022       Reactions   Nitrofurantoin Hives   Amoxicillin Rash        Medication List     TAKE these medications    atorvastatin 10 MG tablet Commonly known as: LIPITOR Take 10 mg by mouth at bedtime.   BD Pen Needle Nano 2nd Gen 32G X 4 MM Misc Generic drug: Insulin Pen Needle USE AS DIRECTED WITH SAXENDA   BERBERINE COMPLEX PO Take 1 tablet by mouth daily.  desvenlafaxine 100 MG 24 hr tablet Commonly known as: PRISTIQ Take 100 mg by mouth daily.   diclofenac 75 MG EC tablet Commonly known as: VOLTAREN Take 75 mg by mouth 2 (two) times daily as needed for pain.   esomeprazole 40 MG capsule Commonly known as: NEXIUM Take 40 mg by mouth daily at 12 noon.   estradiol 2 MG tablet Commonly known as: ESTRACE Take 2 mg by mouth daily.   hydrochlorothiazide 25 MG tablet Commonly known as: HYDRODIURIL Take 25 mg by mouth daily.   OVER THE COUNTER MEDICATION Take 2-3 tablets by mouth See admin instructions. [employer]. Take 2 tablets in the morning and 3 tablets in the evening   progesterone 100 MG  capsule Commonly known as: PROMETRIUM Take 100 mg by mouth at bedtime.   Saxenda 18 MG/3ML Sopn Generic drug: Liraglutide -Weight Management INJECT 0.6ML DAILY ON WEEK 1, 1.2ML DAILY ON WEEK 2 , 1.8ML DAILY ON WEEK 3 , 2.4ML DAILY ON WEEK 4, THEN 3ML DAILY ON WEEK 5   traMADol 50 MG tablet Commonly known as: Ultram Take 1-2 tablets (50-100 mg total) by mouth every 6 (six) hours as needed for moderate pain.   valACYclovir 1000 MG tablet Commonly known as: VALTREX Take 1,000 mg by mouth daily.   Vitamin D (Ergocalciferol) 1.25 MG (50000 UNIT) Caps capsule Commonly known as: DRISDOL Take 50,000 Units by mouth every Monday.        Followup:   Follow-up Information     Hollace Hayward, NP Follow up on 07/06/2022.   Why: Stent removal - 10am Contact information: [place]. [place] [address] [phone]

## 2022-06-27 ENCOUNTER — Other Ambulatory Visit (HOSPITAL_COMMUNITY): Payer: Self-pay

## 2022-06-28 ENCOUNTER — Other Ambulatory Visit (HOSPITAL_COMMUNITY): Payer: Self-pay

## 2022-06-28 ENCOUNTER — Other Ambulatory Visit: Payer: Self-pay

## 2022-07-06 LAB — CALCULI, WITH PHOTOGRAPH (CLINICAL LAB)
Calcium Oxalate Monohydrate: 80 %
Carbonate Apatite: 10 %
Mg NH4 PO4 (Struvite): 10 %
Weight Calculi: 472 mg

## 2022-08-06 ENCOUNTER — Other Ambulatory Visit (HOSPITAL_COMMUNITY): Payer: Self-pay

## 2022-08-06 MED ORDER — SAXENDA 18 MG/3ML ~~LOC~~ SOPN
PEN_INJECTOR | SUBCUTANEOUS | 0 refills | Status: DC
  Filled 2022-08-06 – 2022-08-08 (×2): qty 15, 30d supply, fill #0

## 2022-08-06 MED ORDER — INSULIN PEN NEEDLE 32G X 4 MM MISC
3 refills | Status: AC
  Filled 2022-08-06: qty 100, 90d supply, fill #0
  Filled 2022-09-07 – 2022-09-17 (×2): qty 100, 100d supply, fill #0

## 2022-08-08 ENCOUNTER — Other Ambulatory Visit: Payer: Self-pay

## 2022-08-08 ENCOUNTER — Other Ambulatory Visit (HOSPITAL_COMMUNITY): Payer: Self-pay

## 2022-09-07 ENCOUNTER — Other Ambulatory Visit: Payer: Self-pay

## 2022-09-07 ENCOUNTER — Other Ambulatory Visit (HOSPITAL_COMMUNITY): Payer: Self-pay

## 2022-09-07 MED ORDER — SAXENDA 18 MG/3ML ~~LOC~~ SOPN
3.0000 mg | PEN_INJECTOR | Freq: Every day | SUBCUTANEOUS | 0 refills | Status: DC
  Filled 2022-09-07: qty 15, 30d supply, fill #0

## 2022-09-13 ENCOUNTER — Other Ambulatory Visit: Payer: Self-pay

## 2022-09-17 ENCOUNTER — Other Ambulatory Visit (HOSPITAL_COMMUNITY): Payer: Self-pay

## 2022-09-19 ENCOUNTER — Other Ambulatory Visit: Payer: Self-pay

## 2022-09-19 ENCOUNTER — Other Ambulatory Visit (HOSPITAL_COMMUNITY): Payer: Self-pay

## 2022-09-21 ENCOUNTER — Other Ambulatory Visit: Payer: Self-pay

## 2022-09-23 ENCOUNTER — Other Ambulatory Visit: Payer: Self-pay

## 2022-09-24 ENCOUNTER — Other Ambulatory Visit (HOSPITAL_COMMUNITY): Payer: Self-pay

## 2022-10-15 ENCOUNTER — Other Ambulatory Visit (HOSPITAL_COMMUNITY): Payer: Self-pay

## 2022-10-17 ENCOUNTER — Other Ambulatory Visit (HOSPITAL_COMMUNITY): Payer: Self-pay

## 2022-10-17 MED ORDER — SAXENDA 18 MG/3ML ~~LOC~~ SOPN
3.0000 mg | PEN_INJECTOR | Freq: Every day | SUBCUTANEOUS | 0 refills | Status: AC
  Filled 2022-10-17 – 2022-11-04 (×5): qty 15, 30d supply, fill #0

## 2022-10-25 ENCOUNTER — Other Ambulatory Visit (HOSPITAL_COMMUNITY): Payer: Self-pay

## 2022-11-03 ENCOUNTER — Other Ambulatory Visit: Payer: Self-pay

## 2022-11-03 ENCOUNTER — Other Ambulatory Visit (HOSPITAL_COMMUNITY): Payer: Self-pay

## 2022-11-04 ENCOUNTER — Other Ambulatory Visit: Payer: Self-pay

## 2022-11-04 ENCOUNTER — Other Ambulatory Visit (HOSPITAL_COMMUNITY): Payer: Self-pay

## 2022-11-07 ENCOUNTER — Other Ambulatory Visit: Payer: Self-pay

## 2022-11-09 ENCOUNTER — Other Ambulatory Visit: Payer: Self-pay

## 2022-11-12 ENCOUNTER — Other Ambulatory Visit (HOSPITAL_COMMUNITY): Payer: Self-pay

## 2022-11-12 MED ORDER — ZEPBOUND 2.5 MG/0.5ML ~~LOC~~ SOAJ
2.5000 mg | SUBCUTANEOUS | 0 refills | Status: AC
  Filled 2022-11-12: qty 2, 28d supply, fill #0

## 2022-11-14 ENCOUNTER — Other Ambulatory Visit (HOSPITAL_COMMUNITY): Payer: Self-pay

## 2022-11-30 ENCOUNTER — Other Ambulatory Visit (HOSPITAL_COMMUNITY): Payer: Self-pay

## 2022-11-30 MED ORDER — ZEPBOUND 5 MG/0.5ML ~~LOC~~ SOAJ
5.0000 mg | SUBCUTANEOUS | 0 refills | Status: DC
  Filled 2022-11-30: qty 2, 28d supply, fill #0

## 2022-12-31 ENCOUNTER — Other Ambulatory Visit (HOSPITAL_COMMUNITY): Payer: Self-pay

## 2022-12-31 MED ORDER — ZEPBOUND 5 MG/0.5ML ~~LOC~~ SOAJ
SUBCUTANEOUS | 0 refills | Status: AC
  Filled 2022-12-31: qty 2, 28d supply, fill #0

## 2023-01-02 ENCOUNTER — Other Ambulatory Visit (HOSPITAL_COMMUNITY): Payer: Self-pay

## 2023-01-03 ENCOUNTER — Other Ambulatory Visit (HOSPITAL_COMMUNITY): Payer: Self-pay

## 2023-01-27 ENCOUNTER — Other Ambulatory Visit (HOSPITAL_COMMUNITY): Payer: Self-pay

## 2023-01-27 MED ORDER — ZEPBOUND 7.5 MG/0.5ML ~~LOC~~ SOAJ
SUBCUTANEOUS | 0 refills | Status: AC
  Filled 2023-01-27: qty 2, 28d supply, fill #0

## 2023-02-24 ENCOUNTER — Other Ambulatory Visit (HOSPITAL_COMMUNITY): Payer: Self-pay

## 2023-02-24 MED ORDER — ZEPBOUND 10 MG/0.5ML ~~LOC~~ SOAJ
10.0000 mg | SUBCUTANEOUS | 0 refills | Status: AC
  Filled 2023-02-24 – 2023-03-10 (×2): qty 2, 28d supply, fill #0

## 2023-02-27 ENCOUNTER — Other Ambulatory Visit: Payer: Self-pay

## 2023-03-04 ENCOUNTER — Other Ambulatory Visit (HOSPITAL_COMMUNITY): Payer: Self-pay

## 2023-03-10 ENCOUNTER — Other Ambulatory Visit (HOSPITAL_COMMUNITY): Payer: Self-pay

## 2023-03-21 ENCOUNTER — Other Ambulatory Visit (HOSPITAL_COMMUNITY): Payer: Self-pay

## 2024-05-31 ENCOUNTER — Other Ambulatory Visit (HOSPITAL_BASED_OUTPATIENT_CLINIC_OR_DEPARTMENT_OTHER): Payer: Self-pay
